# Patient Record
Sex: Female | Born: 1990 | Race: White | Hispanic: No | Marital: Single | Smoking: Never smoker
Health system: Southern US, Community
[De-identification: ages and names within clinical notes are randomized; demographics above are authoritative.]

## PROBLEM LIST (undated history)

## (undated) DIAGNOSIS — N200 Calculus of kidney: Secondary | ICD-10-CM

## (undated) DIAGNOSIS — E282 Polycystic ovarian syndrome: Secondary | ICD-10-CM

## (undated) HISTORY — PX: TONSILLECTOMY: SUR1361

---

## 2010-05-13 ENCOUNTER — Emergency Department (HOSPITAL_BASED_OUTPATIENT_CLINIC_OR_DEPARTMENT_OTHER): Admission: EM | Admit: 2010-05-13 | Discharge: 2010-05-13 | Payer: Self-pay | Admitting: Emergency Medicine

## 2010-05-13 ENCOUNTER — Ambulatory Visit: Payer: Self-pay | Admitting: Diagnostic Radiology

## 2011-01-19 LAB — HEPATIC FUNCTION PANEL
ALT: 11 U/L (ref 0–35)
AST: 20 U/L (ref 0–37)
Albumin: 4.1 g/dL (ref 3.5–5.2)
Alkaline Phosphatase: 65 U/L (ref 39–117)
Bilirubin, Direct: 0 mg/dL (ref 0.0–0.3)
Total Bilirubin: 0.5 mg/dL (ref 0.3–1.2)
Total Protein: 7.9 g/dL (ref 6.0–8.3)

## 2011-01-19 LAB — PREGNANCY, URINE: Preg Test, Ur: NEGATIVE

## 2011-01-19 LAB — URINALYSIS, ROUTINE W REFLEX MICROSCOPIC: pH: 5.5 (ref 5.0–8.0)

## 2011-01-19 LAB — BASIC METABOLIC PANEL
BUN: 14 mg/dL (ref 6–23)
Calcium: 9.5 mg/dL (ref 8.4–10.5)
GFR calc non Af Amer: >60 mL/min (ref >60)
Potassium: 3.4 mEq/L — ABNORMAL LOW (ref 3.5–5.1)

## 2011-01-19 LAB — CBC
HCT: 40.2 % (ref 36.0–46.0)
MCH: 28.4 pg (ref 26.0–34.0)
MCHC: 33 g/dL (ref 30.0–36.0)
MCV: 86.1 fL (ref 78.0–100.0)
WBC: 15.2 10*3/uL — ABNORMAL HIGH (ref 4.0–10.5)

## 2011-01-19 LAB — LIPASE, BLOOD: Lipase: 89 U/L (ref 23–300)

## 2011-01-19 LAB — DIFFERENTIAL
Eosinophils Relative: 1 % (ref 0–5)
Lymphocytes Relative: 31 % (ref 12–46)
Monocytes Relative: 6 % (ref 3–12)
Neutrophils Relative %: 62 % (ref 43–77)

## 2011-01-19 LAB — URINE MICROSCOPIC-ADD ON

## 2011-10-06 DIAGNOSIS — L219 Seborrheic dermatitis, unspecified: Secondary | ICD-10-CM | POA: Insufficient documentation

## 2013-02-04 ENCOUNTER — Emergency Department (HOSPITAL_BASED_OUTPATIENT_CLINIC_OR_DEPARTMENT_OTHER)
Admission: EM | Admit: 2013-02-04 | Discharge: 2013-02-04 | Disposition: A | Discharge status: Home / Self Care | Payer: BC Managed Care – PPO | Attending: Emergency Medicine | Admitting: Emergency Medicine

## 2013-02-04 ENCOUNTER — Emergency Department (HOSPITAL_BASED_OUTPATIENT_CLINIC_OR_DEPARTMENT_OTHER): Payer: BC Managed Care – PPO

## 2013-02-04 ENCOUNTER — Encounter (HOSPITAL_BASED_OUTPATIENT_CLINIC_OR_DEPARTMENT_OTHER): Payer: Self-pay | Admitting: *Deleted

## 2013-02-04 DIAGNOSIS — N23 Unspecified renal colic: Secondary | ICD-10-CM | POA: Insufficient documentation

## 2013-02-04 DIAGNOSIS — Z79899 Other long term (current) drug therapy: Secondary | ICD-10-CM | POA: Insufficient documentation

## 2013-02-04 DIAGNOSIS — Z3202 Encounter for pregnancy test, result negative: Secondary | ICD-10-CM | POA: Insufficient documentation

## 2013-02-04 DIAGNOSIS — Z862 Personal history of diseases of the blood and blood-forming organs and certain disorders involving the immune mechanism: Secondary | ICD-10-CM | POA: Insufficient documentation

## 2013-02-04 DIAGNOSIS — Z87442 Personal history of urinary calculi: Secondary | ICD-10-CM | POA: Insufficient documentation

## 2013-02-04 DIAGNOSIS — Z8639 Personal history of other endocrine, nutritional and metabolic disease: Secondary | ICD-10-CM | POA: Insufficient documentation

## 2013-02-04 HISTORY — DX: Polycystic ovarian syndrome: E28.2

## 2013-02-04 HISTORY — DX: Calculus of kidney: N20.0

## 2013-02-04 LAB — URINALYSIS, ROUTINE W REFLEX MICROSCOPIC
Bilirubin Urine: NEGATIVE
Glucose, UA: NEGATIVE mg/dL
Protein, ur: NEGATIVE mg/dL
pH: 6 (ref 5.0–8.0)

## 2013-02-04 LAB — URINE MICROSCOPIC-ADD ON

## 2013-02-04 LAB — PREGNANCY, URINE: Preg Test, Ur: NEGATIVE

## 2013-02-04 MED ORDER — NAPROXEN 250 MG PO TABS
500.0000 mg | ORAL_TABLET | Freq: Once | ORAL | Status: AC
  Administered 2013-02-04: 500 mg via ORAL
  Filled 2013-02-04: qty 2

## 2013-02-04 MED ORDER — HYDROMORPHONE HCL 4 MG PO TABS: 2.0000 mg | ORAL_TABLET | ORAL | Status: DC | PRN

## 2013-02-04 MED ORDER — NAPROXEN SODIUM 220 MG PO TABS: ORAL_TABLET | ORAL | Status: DC

## 2013-02-04 NOTE — ED Provider Notes (Signed)
History     CSN: [id number]  Arrival date & time 02/04/13  0258   First MD Initiated Contact with Patient 02/04/13 [phone]      Chief Complaint  Patient presents with  . Abdominal Pain    (Consider location/radiation/quality/duration/timing/severity/associated sxs/prior treatment) HPI This is a 22 year old female with history of kidney stones. She also has a questionable history of polycystic ovary disease. She is here with a 24-hour history of pain in the left lower quadrant of the abdomen. The pain is mild to moderate. It is similar to prior or kidney stone pain. There is some associated pain in the left flank. The pain is not worse with movement or ambulation. She has taken ibuprofen without significant relief. There is no associated hematuria, dysuria, fever, chills, nausea, vomiting, vaginal bleeding or vaginal discharge. She has had diarrhea.  Past Medical History  Diagnosis Date  . Kidney stone   . Polycystic disease, ovaries     History reviewed. No pertinent past surgical history.  History reviewed. No pertinent family history.  History  Substance Use Topics  . Smoking status: Never Smoker   . Smokeless tobacco: Not on file  . Alcohol Use: No    OB History   Grav Para Term Preterm Abortions TAB SAB Ect Mult Living                  Review of Systems  All other systems reviewed and are negative.    Allergies  Review of patient's allergies indicates no known allergies.  Home Medications   Current Outpatient Rx  Name  Route  Sig  Dispense  Refill  . metFORMIN (GLUCOPHAGE-XR) 750 MG 24 hr tablet   Oral   Take 750 mg by mouth daily with breakfast.         . spironolactone (ALDACTONE) 100 MG tablet   Oral   Take 200 mg by mouth daily.           BP 128/79  Pulse 82  Temp(Src) 98.6 F (37 C) (Oral)  Resp 15  Ht 5\' 6"  (1.676 m)  Wt 158 lb (71.668 kg)  BMI 25.51 kg/m2  SpO2 100%  LMP 01/30/2013  Physical Exam General: Well-developed,  well-nourished female in no acute distress; appearance consistent with age of record HENT: normocephalic, atraumatic Eyes: pupils equal round and reactive to light; extraocular muscles intact Neck: supple Heart: regular rate and rhythm; no murmurs, rubs or gallops Lungs: clear to auscultation bilaterally Abdomen: soft; nondistended; mild tenderness in left lower quadrant on deep palpation; no masses or hepatosplenomegaly; bowel sounds present GU: No CVA tenderness Extremities: No deformity; full range of motion; pulses normal; no edema Neurologic: Awake, alert and oriented; motor function intact in all extremities and symmetric; no facial droop Skin: Warm and dry Psychiatric: Normal mood and affect    ED Course  Procedures (including critical care time)     MDM   Nursing notes and vitals signs, including pulse oximetry, reviewed.  Summary of this visit's results, reviewed by myself:  Labs:  Results for orders placed during the hospital encounter of 02/04/13 (from the past 24 hour(s))  URINALYSIS, ROUTINE W REFLEX MICROSCOPIC     Status: Abnormal   Collection Time    02/04/13  3:12 AM      Result Value Range   Color, Urine YELLOW  YELLOW   APPearance HAZY (*) CLEAR   Specific Gravity, Urine 1.013  1.005 - 1.030   pH 6.0  5.0 - 8.0  Glucose, UA NEGATIVE  NEGATIVE mg/dL   Hgb urine dipstick LARGE (*) NEGATIVE   Bilirubin Urine NEGATIVE  NEGATIVE   Ketones, ur NEGATIVE  NEGATIVE mg/dL   Protein, ur NEGATIVE  NEGATIVE mg/dL   Urobilinogen, UA 0.2  0.0 - 1.0 mg/dL   Nitrite NEGATIVE  NEGATIVE   Leukocytes, UA NEGATIVE  NEGATIVE  PREGNANCY, URINE     Status: None   Collection Time    02/04/13  3:12 AM      Result Value Range   Preg Test, Ur NEGATIVE  NEGATIVE  URINE MICROSCOPIC-ADD ON     Status: None   Collection Time    02/04/13  3:12 AM      Result Value Range   Squamous Epithelial / LPF RARE  RARE   WBC, UA 0-2  <3 WBC/hpf   RBC / HPF 21-50  <3 RBC/hpf    Urine-Other AMORPHOUS URATES/PHOSPHATES      Imaging Studies: Ct Abdomen Pelvis Wo Contrast  02/04/2013  *RADIOLOGY REPORT*  Clinical Data: Left flank pain.  Left lower quadrant pain. Hematuria.  History of renal stones.  CT ABDOMEN AND PELVIS WITHOUT CONTRAST  Technique:  Multidetector CT imaging of the abdomen and pelvis was performed following the standard protocol without intravenous contrast.  Comparison: 05/13/2010  Findings: The lung bases are clear.  There is a 3 mm stone in the left ureteropelvic junction without significant proximal obstruction.  Mild periureteral stranding. Punctate sized nonobstructing stone in the lower pole of the right kidney.  Bladder is decompressed but there is no evidence of significant bladder wall thickening or stones.  The unenhanced appearance of the liver, spleen, gallbladder, pancreas, adrenal glands, abdominal aorta, inferior vena cava, and retroperitoneal lymph nodes is unremarkable.  The stomach and small bowel are decompressed.  Stool fills the colon without distension. No free air or free fluid in the abdomen.  Pelvis:  The appendix is normal.  No free or loculated pelvic fluid collections.  Tampon in the vagina.  Uterus and adnexal structures are not enlarged.  No significant pelvic lymphadenopathy. Scattered diverticula in the sigmoid colon without diverticulitis. Normal alignment of the lumbar vertebrae.  IMPRESSION: 3 mm stone in the left ureteropelvic junction with minimal proximal obstructive changes.  Punctate sized nonobstructing stone in the right kidney.   Original Report Authenticated By: Burman Nieves, M.D.             Hanley Seamen, MD 02/04/13 [phone]

## 2013-02-04 NOTE — ED Notes (Signed)
C/o LLQ pain x 24 hours with nausea. Denies any urinary sxs or vaginal d/c.

## 2013-02-04 NOTE — ED Notes (Signed)
MD at bedside. 

## 2013-02-04 NOTE — ED Notes (Signed)
Patient transported to CT 

## 2013-02-25 ENCOUNTER — Encounter (HOSPITAL_COMMUNITY): Payer: Self-pay | Admitting: Emergency Medicine

## 2013-02-25 ENCOUNTER — Emergency Department (HOSPITAL_COMMUNITY)
Admission: EM | Admit: 2013-02-25 | Discharge: 2013-02-26 | Disposition: A | Discharge status: Home / Self Care | Payer: BC Managed Care – PPO | Attending: Emergency Medicine | Admitting: Emergency Medicine

## 2013-02-25 DIAGNOSIS — Z3202 Encounter for pregnancy test, result negative: Secondary | ICD-10-CM | POA: Insufficient documentation

## 2013-02-25 DIAGNOSIS — R112 Nausea with vomiting, unspecified: Secondary | ICD-10-CM | POA: Insufficient documentation

## 2013-02-25 DIAGNOSIS — R109 Unspecified abdominal pain: Secondary | ICD-10-CM | POA: Insufficient documentation

## 2013-02-25 DIAGNOSIS — Z862 Personal history of diseases of the blood and blood-forming organs and certain disorders involving the immune mechanism: Secondary | ICD-10-CM | POA: Insufficient documentation

## 2013-02-25 DIAGNOSIS — Z79899 Other long term (current) drug therapy: Secondary | ICD-10-CM | POA: Insufficient documentation

## 2013-02-25 DIAGNOSIS — Z8639 Personal history of other endocrine, nutritional and metabolic disease: Secondary | ICD-10-CM | POA: Insufficient documentation

## 2013-02-25 DIAGNOSIS — R350 Frequency of micturition: Secondary | ICD-10-CM | POA: Insufficient documentation

## 2013-02-25 DIAGNOSIS — R3915 Urgency of urination: Secondary | ICD-10-CM | POA: Insufficient documentation

## 2013-02-25 DIAGNOSIS — N200 Calculus of kidney: Secondary | ICD-10-CM | POA: Insufficient documentation

## 2013-02-25 LAB — URINALYSIS, ROUTINE W REFLEX MICROSCOPIC
Bilirubin Urine: NEGATIVE
Glucose, UA: NEGATIVE mg/dL
Ketones, ur: 15 mg/dL — AB
Leukocytes, UA: NEGATIVE
Nitrite: NEGATIVE
Protein, ur: 30 mg/dL — AB
Specific Gravity, Urine: 1.03 (ref 1.005–1.030)

## 2013-02-25 LAB — URINE MICROSCOPIC-ADD ON

## 2013-02-25 NOTE — ED Notes (Addendum)
Pt states she went to urologist Thurs and was informed she had a Left side kidney stone. MD told pt unsure if the kidney stone would pass. If not, and pain did not get better to go back to the urologist and they would remove it.

## 2013-02-26 ENCOUNTER — Emergency Department (HOSPITAL_COMMUNITY): Payer: BC Managed Care – PPO

## 2013-02-26 LAB — POCT I-STAT, CHEM 8
Glucose, Bld: 95 mg/dL (ref 70–99)
HCT: 38 % (ref 36.0–46.0)
Hemoglobin: 12.9 g/dL (ref 12.0–15.0)
Potassium: 3.9 mEq/L (ref 3.5–5.1)
Sodium: 140 mEq/L (ref 135–145)

## 2013-02-26 MED ORDER — HYDROMORPHONE HCL PF 1 MG/ML IJ SOLN
1.0000 mg | Freq: Once | INTRAMUSCULAR | Status: AC
  Administered 2013-02-26: 1 mg via INTRAVENOUS
  Filled 2013-02-26: qty 1

## 2013-02-26 MED ORDER — ONDANSETRON HCL 4 MG/2ML IJ SOLN
4.0000 mg | Freq: Once | INTRAMUSCULAR | Status: AC
  Administered 2013-02-26: 4 mg via INTRAVENOUS
  Filled 2013-02-26: qty 2

## 2013-02-26 MED ORDER — OXYCODONE-ACETAMINOPHEN 5-325 MG PO TABS
1.0000 | ORAL_TABLET | Freq: Once | ORAL | Status: AC
  Administered 2013-02-26: 1 via ORAL
  Filled 2013-02-26: qty 1

## 2013-02-26 MED ORDER — OXYCODONE-ACETAMINOPHEN 5-325 MG PO TABS: 1.0000 | ORAL_TABLET | Freq: Four times a day (QID) | ORAL | Status: DC | PRN

## 2013-02-26 MED ORDER — SODIUM CHLORIDE 0.9 % IV BOLUS (SEPSIS)
500.0000 mL | Freq: Once | INTRAVENOUS | Status: AC
  Administered 2013-02-26: 500 mL via INTRAVENOUS

## 2013-02-26 NOTE — ED Provider Notes (Signed)
Medical screening examination/treatment/procedure(s) were performed by non-physician practitioner and as supervising physician I was immediately available for consultation/collaboration.  Jones Skene, M.D.  Jones Skene, MD 02/26/13 [phone]

## 2013-02-26 NOTE — ED Provider Notes (Signed)
History     CSN: [id number]  Arrival date & time 02/25/13  2246   First MD Initiated Contact with Patient 02/26/13 0015      No chief complaint on file.   (Consider location/radiation/quality/duration/timing/severity/associated sxs/prior treatment) The history is provided by the patient, medical records and a parent. No language interpreter was used.    Olivia Le is a 22 y.o. female  with a hx of kidney stones presents to the Emergency Department complaining of gradual, persistent, progressively worsening L flank pain and L abdominal pain onset tonight about 7pm.  Pt describes the pain as sharp and throbbing, worst in the LLQ of the abd radiating down from the L flank, rated at a 10/10. Pt saw Dr Brunilda Payor this morning who said that the stone might pass but if not then they would talk about taking it out on Monday.  Associated symptoms include nausea and vomiting.  Nothing makes it better and nothing makes it worse. Patient denies fever, chills, headache, neck pain, chest pain, shortness of breath, diarrhea, weakness, dizziness, syncope, rash.    Past Medical History  Diagnosis Date  . Kidney stone   . Polycystic disease, ovaries     History reviewed. No pertinent past surgical history.  History reviewed. No pertinent family history.  History  Substance Use Topics  . Smoking status: Never Smoker   . Smokeless tobacco: Not on file  . Alcohol Use: No    OB History   Grav Para Term Preterm Abortions TAB SAB Ect Mult Living                  Review of Systems  Constitutional: Negative for fever, diaphoresis, appetite change and fatigue.  Respiratory: Negative for shortness of breath.   Cardiovascular: Negative for chest pain.  Gastrointestinal: Positive for nausea, vomiting and abdominal pain. Negative for diarrhea, constipation and blood in stool.  Genitourinary: Positive for urgency, frequency and flank pain. Negative for dysuria, hematuria and difficulty urinating.   Musculoskeletal: Negative for back pain.  Skin: Negative for rash.  Neurological: Negative for headaches.  All other systems reviewed and are negative.    Allergies  Review of patient's allergies indicates no known allergies.  Home Medications   Current Outpatient Rx  Name  Route  Sig  Dispense  Refill  . HYDROmorphone (DILAUDID) 4 MG tablet   Oral   Take 0.5-1 tablets (2-4 mg total) by mouth every 4 (four) hours as needed for pain.   30 tablet   0   . metFORMIN (GLUCOPHAGE-XR) 750 MG 24 hr tablet   Oral   Take 750 mg by mouth daily with breakfast.         . naproxen sodium (ALEVE) 220 MG tablet      Take 2 tablets every 12 hours until stone passes.         Marland Kitchen oxyCODONE-acetaminophen (PERCOCET/ROXICET) 5-325 MG per tablet   Oral   Take 1.5 tablets by mouth every 4 (four) hours as needed for pain.         Marland Kitchen spironolactone (ALDACTONE) 100 MG tablet   Oral   Take 200 mg by mouth daily.           BP 137/90  Pulse 110  Temp(Src) 98 F (36.7 C) (Oral)  Resp 20  Wt 158 lb (71.668 kg)  BMI 25.51 kg/m2  SpO2 96%  LMP 02/25/2013  Physical Exam  Nursing note and vitals reviewed. Constitutional: She appears well-developed and well-nourished. No distress.  HENT:  Head: Normocephalic and atraumatic.  Mouth/Throat: Oropharynx is clear and moist. No oropharyngeal exudate.  Eyes: Conjunctivae and EOM are normal. Pupils are equal, round, and reactive to light.  Neck: Normal range of motion.  Cardiovascular: Regular rhythm, normal heart sounds and intact distal pulses.  Tachycardia present.   Pulses:      Radial pulses are 2+ on the right side, and 2+ on the left side.       Dorsalis pedis pulses are 2+ on the right side, and 2+ on the left side.       Posterior tibial pulses are 2+ on the right side, and 2+ on the left side.  Pulmonary/Chest: Effort normal and breath sounds normal. No respiratory distress. She has no wheezes. She has no rales. She exhibits no  tenderness.  Abdominal: Soft. Normal appearance and bowel sounds are normal. She exhibits no distension and no mass. There is tenderness (mild) in the left lower quadrant. There is CVA tenderness (Left). There is no rebound and no guarding.  Musculoskeletal: Normal range of motion. She exhibits no edema and no tenderness.  Lymphadenopathy:    She has no cervical adenopathy.  Neurological: She is alert. She exhibits normal muscle tone. Coordination normal.  Skin: Skin is warm and dry. No rash noted. She is not diaphoretic. No erythema.  Psychiatric: She has a normal mood and affect.    ED Course  Procedures (including critical care time)  Labs Reviewed  URINALYSIS, ROUTINE W REFLEX MICROSCOPIC - Abnormal; Notable for the following:    Hgb urine dipstick SMALL (*)    Ketones, ur 15 (*)    Protein, ur 30 (*)    All other components within normal limits  URINE MICROSCOPIC-ADD ON - Abnormal; Notable for the following:    Crystals CA OXALATE CRYSTALS (*)    All other components within normal limits  PREGNANCY, URINE   Dg Abd 1 View  02/26/2013  *RADIOLOGY REPORT*  Clinical Data: Left-sided flank pain, nausea and vomiting  ABDOMEN - 1 VIEW  Comparison: 04/24/2014radiograph at [facility], report not available  Findings: Normal bowel gas pattern.  No abnormal radiopacity. Presumed left pelvic phlebolith again noted.  Mild rightward curvature of the thoracolumbar spine centered at T12 is likely positional. Presence or absence of air fluid levels or free air cannot be assessed on this single supine view.  IMPRESSION: Normal bowel gas pattern.  No abnormal radiopacity.   Original Report Authenticated By: Christiana Pellant, M.D.      1. Nephrolithiasis       MDM  Olivia Le presents with hx of kidney stone and was seen by Dr Brunilda Payor today.  I reviewed the CT on 02/04/13 which showed a 3mm stone at the UVJ without significant hydronephrosis.  Pt has been diagnosed with a Kidney  Stone via CT 3 weeks ago.  KUB with L pelvic phlebolith noted.  I personally reviewed the imaging tests through PACS system.  I reviewed available ER/hospitalization records through the EMR.  There is no evidence of significant hydronephrosis, UA without evidence of infection, vitals sign stable and the pt does not have irratractable vomiting.   Discussed with Olivia Le who will follow chem 8 to check kidney function.  If normal, plan is for Pt to be dc home with pain medications & follow up with Dr Brunilda Payor regarding further plans.          Olivia Client Antoinette Haskett, PA-C 02/26/13 0136

## 2013-02-26 NOTE — ED Provider Notes (Signed)
  Physical Exam  BP 137/90  Pulse 110  Temp(Src) 98 F (36.7 C) (Oral)  Resp 20  Wt 158 lb (71.668 kg)  BMI 25.51 kg/m2  SpO2 96%  LMP 02/25/2013  Physical Exam Patient states she has received significant pain relief, but voices concern that the Vicodin.  At home, may not be strong enough to keep her comfortable until her appointment on Monday.  I discussed with her and her mother, the use of stronger medication called, Percocet.  They're in agreement that they will not use both medications at the same time ED Course  Procedures  MDM       Arman Filter, NP 02/26/13 [phone]

## 2013-02-26 NOTE — ED Provider Notes (Signed)
Medical screening examination/treatment/procedure(s) were performed by non-physician practitioner and as supervising physician I was immediately available for consultation/collaboration.  John-Adam Emrik Erhard, M.D.  John-Adam Malon Branton, MD 02/26/13 0741 

## 2014-09-28 IMAGING — CT CT ABD-PELV W/O CM
2 of 4 series · 16 of 46 positions shown, 18 images · non-contrast
Comparison: 05/13/2010

CLINICAL DATA: Left flank pain.  Left lower quadrant pain.
Hematuria.  History of renal stones.

CT ABDOMEN AND PELVIS WITHOUT CONTRAST
TECHNIQUE: Multidetector CT imaging of the abdomen and pelvis was
performed following the standard protocol without intravenous
contrast.

[Series 2: renal stone < 200 lbs 5.0 b31f · axial · 0.72mm/px · z∈[+635,+1080]mm · 13 of 99 slices shown, 15 images]
[im 5/99  soft-tissue]
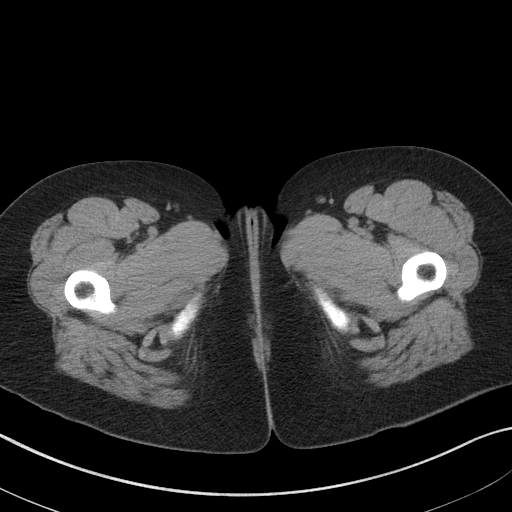
[im 5/99  bone]
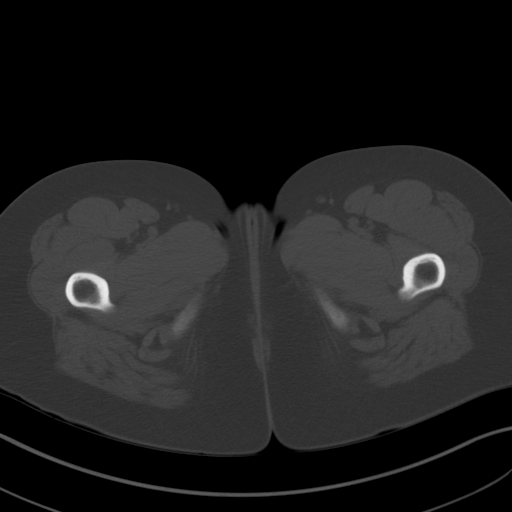
[im 13/99  soft-tissue]
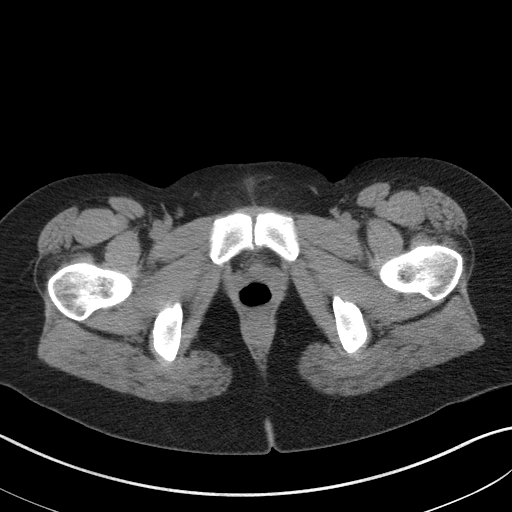
[im 21/99  soft-tissue]
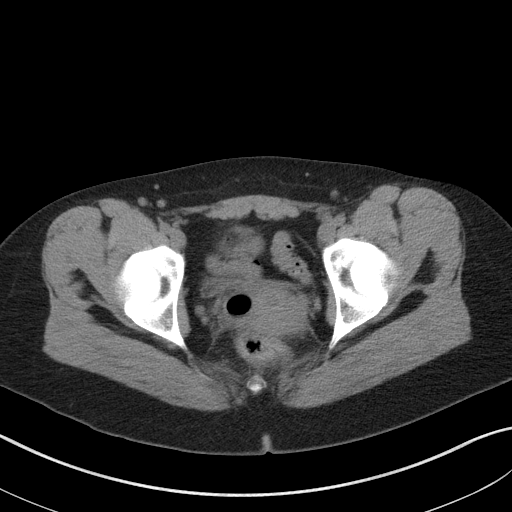
[im 29/99  soft-tissue]
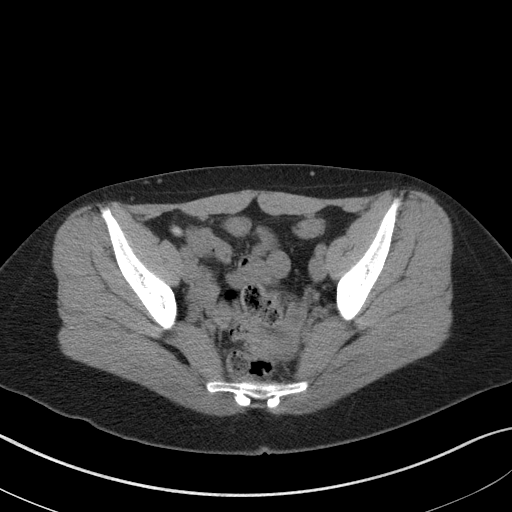
[im 33/99  soft-tissue]
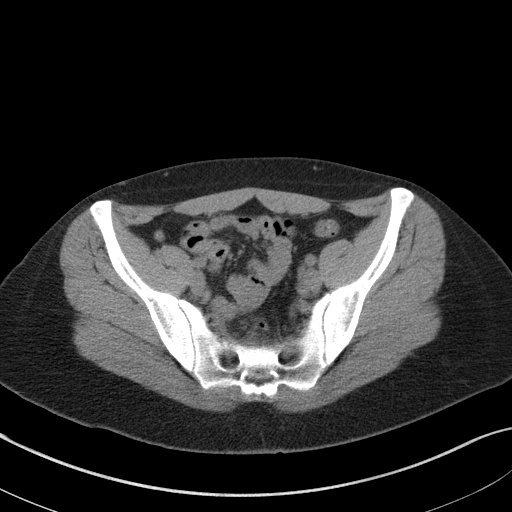
[im 41/99  soft-tissue]
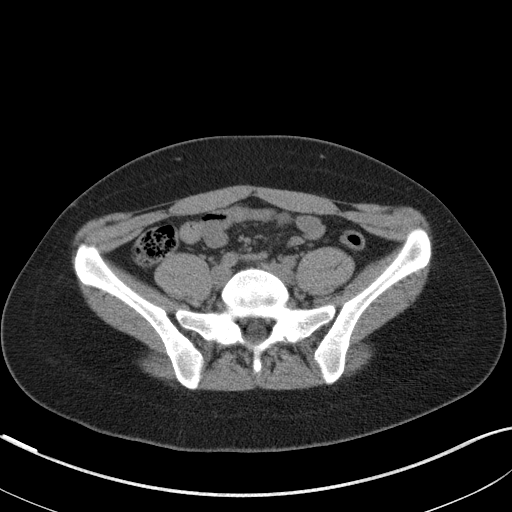
[im 50/99  soft-tissue]
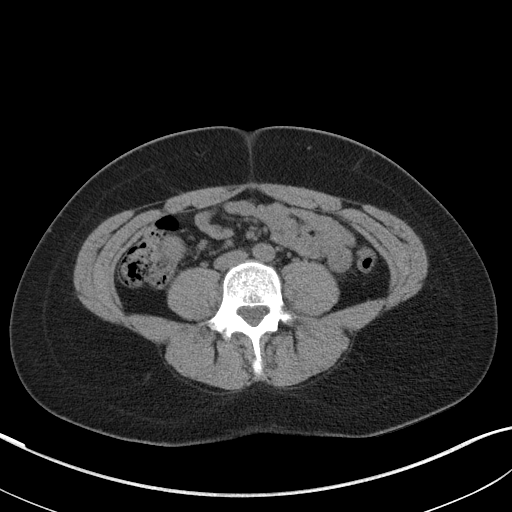
[im 58/99  soft-tissue]
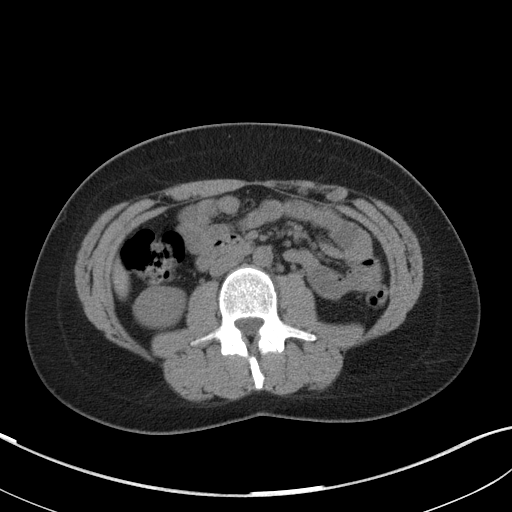
[im 66/99  soft-tissue]
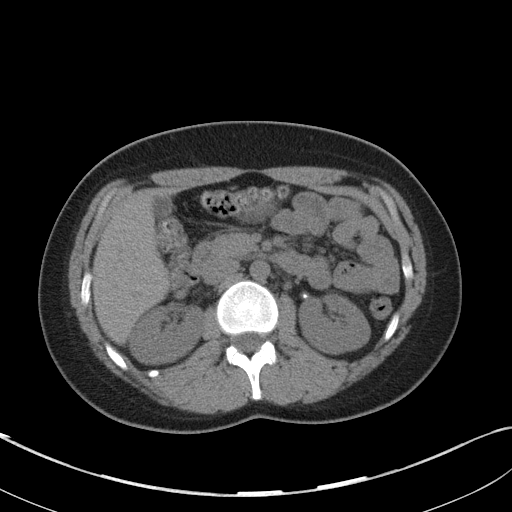
[im 66/99  bone]
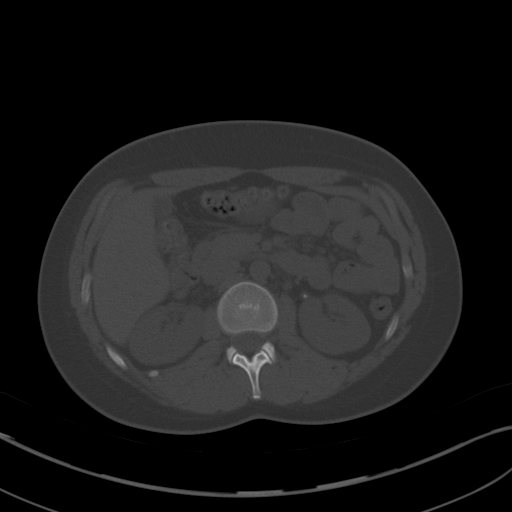
[im 70/99  soft-tissue]
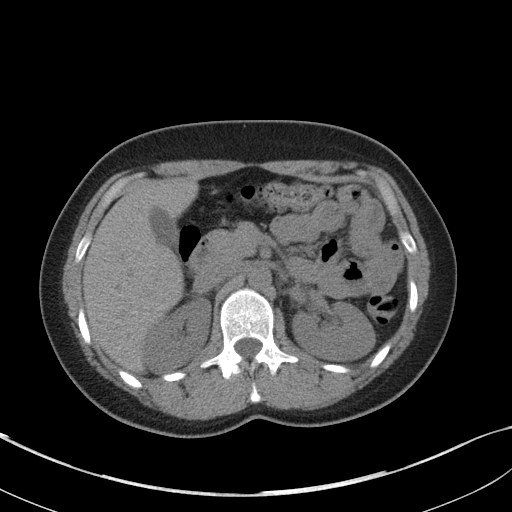
[im 78/99  soft-tissue]
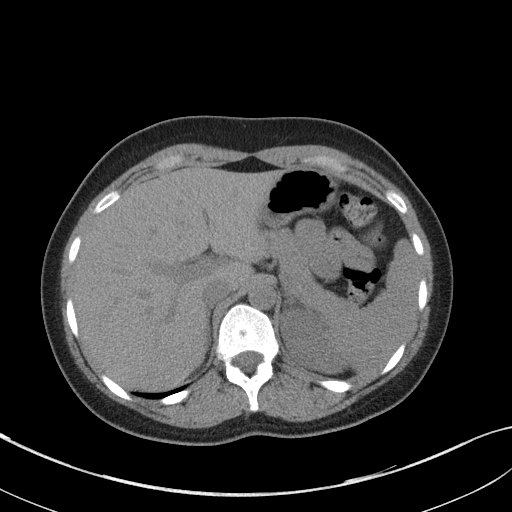
[im 86/99  soft-tissue]
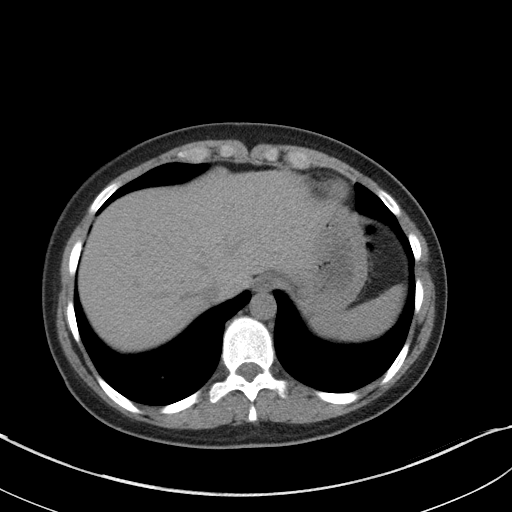
[im 94/99  soft-tissue]
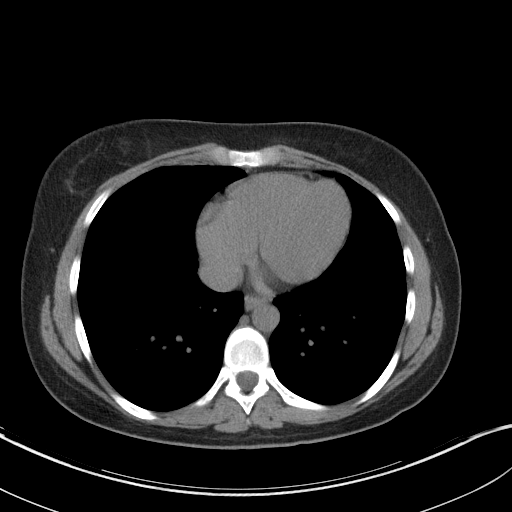

[Series 5: renal stone 3.0 coronal · coronal · 0.64mm/px · 3 of 71 slices shown]
[im 24/71  soft-tissue]
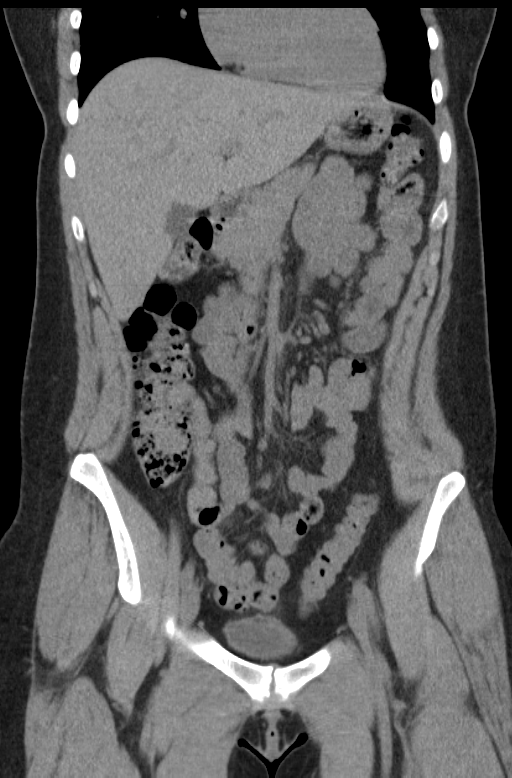
[im 32/71  soft-tissue]
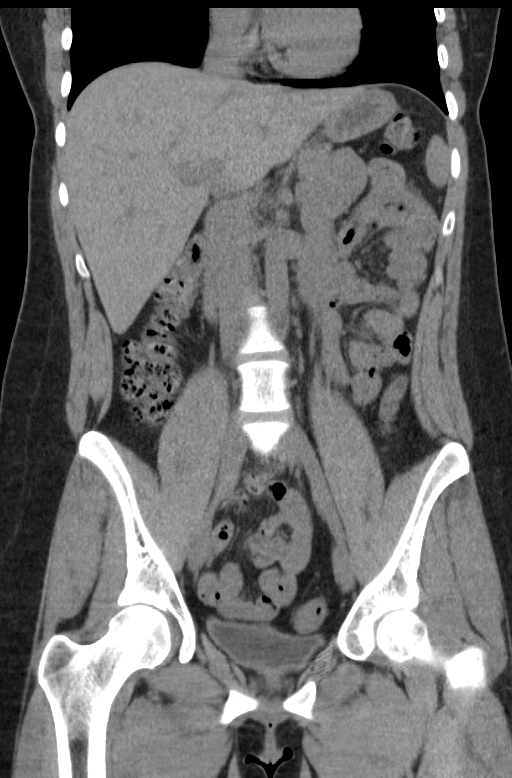
[im 39/71  soft-tissue]
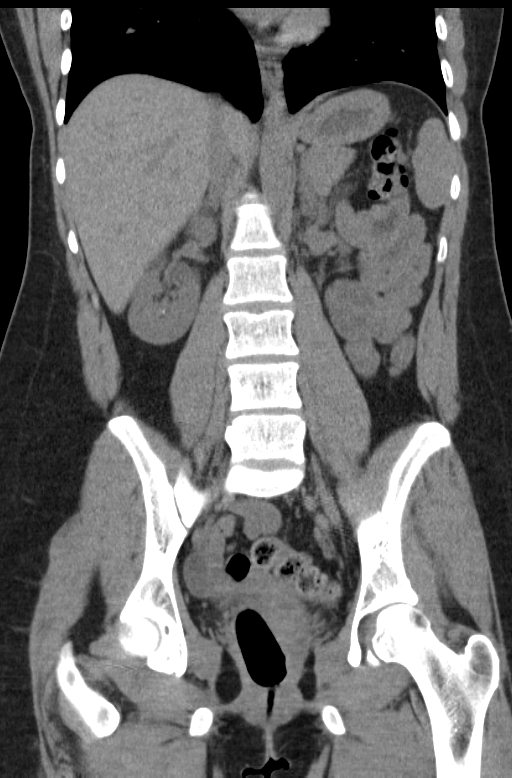

[16 of 46 positions shown; findings below may reference images not displayed]

FINDINGS: The lung bases are clear.

There is a 3 mm stone in the left ureteropelvic junction without
significant proximal obstruction.  Mild periureteral stranding.
Punctate sized nonobstructing stone in the lower pole of the right
kidney.  Bladder is decompressed but there is no evidence of
significant bladder wall thickening or stones.

The unenhanced appearance of the liver, spleen, gallbladder,
pancreas, adrenal glands, abdominal aorta, inferior vena cava, and
retroperitoneal lymph nodes is unremarkable.  The stomach and small
bowel are decompressed.  Stool fills the colon without distension.
No free air or free fluid in the abdomen.

Pelvis:  The appendix is normal.  No free or loculated pelvic fluid
collections.  Tampon in the vagina.  Uterus and adnexal structures
are not enlarged.  No significant pelvic lymphadenopathy.
Scattered diverticula in the sigmoid colon without diverticulitis.
Normal alignment of the lumbar vertebrae.
IMPRESSION: 3 mm stone in the left ureteropelvic junction with minimal proximal
obstructive changes.  Punctate sized nonobstructing stone in the
right kidney.

## 2016-05-28 ENCOUNTER — Other Ambulatory Visit: Payer: Self-pay | Admitting: Chiropractic Medicine

## 2019-06-21 DIAGNOSIS — F3342 Major depressive disorder, recurrent, in full remission: Secondary | ICD-10-CM | POA: Insufficient documentation

## 2019-07-02 ENCOUNTER — Ambulatory Visit (HOSPITAL_COMMUNITY): Payer: PRIVATE HEALTH INSURANCE

## 2019-07-02 ENCOUNTER — Ambulatory Visit (INDEPENDENT_AMBULATORY_CARE_PROVIDER_SITE_OTHER): Payer: PRIVATE HEALTH INSURANCE

## 2019-07-02 ENCOUNTER — Encounter (HOSPITAL_COMMUNITY): Payer: Self-pay | Admitting: Emergency Medicine

## 2019-07-02 ENCOUNTER — Other Ambulatory Visit: Payer: Self-pay

## 2019-07-02 ENCOUNTER — Ambulatory Visit (HOSPITAL_COMMUNITY)
Admission: EM | Admit: 2019-07-02 | Discharge: 2019-07-02 | Disposition: A | Discharge status: Home / Self Care | Payer: PRIVATE HEALTH INSURANCE | Attending: Urgent Care | Admitting: Urgent Care

## 2019-07-02 DIAGNOSIS — R0781 Pleurodynia: Secondary | ICD-10-CM | POA: Diagnosis not present

## 2019-07-02 DIAGNOSIS — R0789 Other chest pain: Secondary | ICD-10-CM

## 2019-07-02 DIAGNOSIS — S20212A Contusion of left front wall of thorax, initial encounter: Secondary | ICD-10-CM | POA: Diagnosis not present

## 2019-07-02 MED ORDER — NAPROXEN 500 MG PO TABS: 500.0000 mg | ORAL_TABLET | Freq: Two times a day (BID) | ORAL | 0 refills | Status: DC

## 2019-07-02 MED ORDER — CYCLOBENZAPRINE HCL 5 MG PO TABS: 5.0000 mg | ORAL_TABLET | Freq: Three times a day (TID) | ORAL | 0 refills | Status: DC | PRN

## 2019-07-02 NOTE — ED Triage Notes (Signed)
Pt sts fell and injured left sided rib area several weeks ago that was improved and now more painful again

## 2019-07-02 NOTE — ED Provider Notes (Signed)
MRN: [id number] DOB: 1991-01-18  Subjective:   Olivia Le is a 28 y.o. female presenting for 5 day history of recurrent left sided chest wall/rib pain worse with use of her left arm or taking a deep breath. Patient suffered a fall a few weeks ago. She was dog sitting, stepped over a dog gate and fell into a side dresser. She did not get an x-ray then. Had some bruising that resolved on its own. Has used ibuprofen with some relief except in the past few days, feels like the pain is worse than when she initially hurt herself. Denies smoking cigarettes or alcohol use.   LMP was 06/20/2019, has not been sexually active since then.   Takes Effexor only.    No current facility-administered medications for this encounter.   Current Outpatient Medications:  .  metFORMIN (GLUCOPHAGE-XR) 750 MG 24 hr tablet, Take 750 mg by mouth daily with breakfast., Disp: , Rfl:  .  naproxen sodium (ALEVE) 220 MG tablet, Take 2 tablets every 12 hours until stone passes., Disp: , Rfl:  .  spironolactone (ALDACTONE) 100 MG tablet, Take 200 mg by mouth daily., Disp: , Rfl:     No Known Allergies   Past Medical History:  Diagnosis Date  . Kidney stone   . Polycystic disease, ovaries      History reviewed. No pertinent surgical history.   Review of Systems  Constitutional: Negative for fever and malaise/fatigue.  HENT: Negative for congestion, ear pain, sinus pain and sore throat.   Eyes: Negative for blurred vision, double vision, discharge and redness.  Respiratory: Negative for cough, hemoptysis, shortness of breath and wheezing.   Cardiovascular: Positive for chest pain.  Gastrointestinal: Negative for abdominal pain, diarrhea, nausea and vomiting.  Genitourinary: Negative for dysuria, flank pain and hematuria.  Musculoskeletal: Negative for myalgias.  Skin: Negative for rash.  Neurological: Negative for dizziness, weakness and headaches.  Psychiatric/Behavioral: Negative for depression and  substance abuse.    Objective:   Vitals: BP 128/82 (BP Location: Right Arm)   Pulse 98   Temp 98.4 F (36.9 C) (Oral)   Resp 18   SpO2 97%   Physical Exam Constitutional:      General: She is not in acute distress.    Appearance: Normal appearance. She is well-developed. She is not ill-appearing, toxic-appearing or diaphoretic.  HENT:     Head: Normocephalic and atraumatic.     Nose: Nose normal.     Mouth/Throat:     Mouth: Mucous membranes are moist.  Eyes:     Extraocular Movements: Extraocular movements intact.     Pupils: Pupils are equal, round, and reactive to light.  Cardiovascular:     Rate and Rhythm: Normal rate and regular rhythm.     Pulses: Normal pulses.     Heart sounds: Normal heart sounds. No murmur. No friction rub. No gallop.   Pulmonary:     Effort: Pulmonary effort is normal. No respiratory distress.     Breath sounds: Normal breath sounds. No stridor. No wheezing, rhonchi or rales.  Chest:     Chest wall: Tenderness present. No swelling, crepitus or edema.    Skin:    General: Skin is warm and dry.     Findings: No rash.  Neurological:     Mental Status: She is alert and oriented to person, place, and time.  Psychiatric:        Mood and Affect: Mood normal.        Behavior: Behavior normal.  Thought Content: Thought content normal.     Dg Ribs Unilateral W/chest Left  Result Date: 07/02/2019 CLINICAL DATA:  Left rib injury after fall June 01, 2019. Pain is recently increased, particularly with deep inspiration. EXAM: LEFT RIBS AND CHEST - 3+ VIEW COMPARISON:  None. FINDINGS: No fracture or other bone lesions are seen involving the ribs. There is no evidence of pneumothorax or pleural effusion. Both lungs are clear. Heart size and mediastinal contours are within normal limits. IMPRESSION: Negative. Electronically Signed   By: Gerome Samavid  Williams III M.D   On: 07/02/2019 15:50     Assessment and Plan :   1. Left-sided chest wall pain    2. Chest pain, pleuritic   3. Rib contusion, left, initial encounter     We will manage conservatively for musculoskeletal type pain associated with the rib injury from a few weeks ago.  Counseled on use of NSAID, muscle relaxant and modification of physical activity.  Anticipatory guidance provided.  Counseled patient on potential for adverse effects with medications prescribed/recommended today, ER and return-to-clinic precautions discussed, patient verbalized understanding.    Wallis BambergMani, [place], [place] JerseyPA-C 07/02/19 1559

## 2019-07-08 ENCOUNTER — Telehealth (HOSPITAL_COMMUNITY): Payer: Self-pay

## 2019-07-08 ENCOUNTER — Ambulatory Visit (INDEPENDENT_AMBULATORY_CARE_PROVIDER_SITE_OTHER)
Admission: RE | Admit: 2019-07-08 | Discharge: 2019-07-08 | Disposition: A | Discharge status: Home / Self Care | Payer: PRIVATE HEALTH INSURANCE | Source: Ambulatory Visit

## 2019-07-08 DIAGNOSIS — W01198D Fall on same level from slipping, tripping and stumbling with subsequent striking against other object, subsequent encounter: Secondary | ICD-10-CM | POA: Diagnosis not present

## 2019-07-08 DIAGNOSIS — S20212D Contusion of left front wall of thorax, subsequent encounter: Secondary | ICD-10-CM

## 2019-07-08 DIAGNOSIS — R0789 Other chest pain: Secondary | ICD-10-CM | POA: Diagnosis not present

## 2019-07-08 MED ORDER — METHOCARBAMOL 500 MG PO TABS: 500.0000 mg | ORAL_TABLET | Freq: Two times a day (BID) | ORAL | 0 refills | Status: DC

## 2019-07-08 MED ORDER — MELOXICAM 7.5 MG PO TABS: 7.5000 mg | ORAL_TABLET | Freq: Every day | ORAL | 0 refills | Status: DC

## 2019-07-08 NOTE — ED Provider Notes (Signed)
[facility] Virtual Visit via Video Note:  Olivia Le  initiated request for Telemedicine visit with [facility] team. I connected with Olivia Le  on 07/08/2019 at 5:29 PM  for a synchronized telemedicine visit using a telephone enabled HIPPA compliant telemedicine application. I verified that I am speaking with Olivia Le  using two identifiers. Rennis HardingBrittany Bani Gianfrancesco, PA-C  was physically located in a [facility] site and Olivia MessingGretchen Medero was located at a different location.   The limitations of evaluation and management by telemedicine as well as the availability of in-person appointments were discussed. Patient was informed that she  may incur a bill ( including co-pay) for this virtual visit encounter. Olivia Le  expressed understanding and gave verbal consent to proceed with virtual visit.   [id number] 07/08/19 Arrival Time: 1710  CC: Rib pain  SUBJECTIVE: History from: patient. Olivia Le is a 28 y.o. female complains of left sided rib pain that began a little over 1 month ago, but has worsened over the past week.  States initial injury occurred after she tripped while stepping over a dog gate and hit the left side of her ribs on a side table.  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 Localizes the pain to the left side of her ribs and behind her left breast.  Describes the pain as intermittent and sharp in character.  Was seen on 07/02/19, x-rays were negative for fracture at that time, and was diagnosed with rib contusion.  Was started on naproxen and flexeril, reports minimal relief with these medications.  Symptoms are made worse with deep breath.  Initially had some bruising, but now resolved.  Denies fever, chills, nausea, vomiting, erythema, ecchymosis, effusion, chest pain, SOB, dyspnea.    ROS: As per HPI.  All  other pertinent ROS negative.     Past Medical History:  Diagnosis Date  . Kidney stone   . Polycystic disease, ovaries    No past surgical history on file. No Known Allergies No current facility-administered medications on file prior to encounter.    Current Outpatient Medications on File Prior to Encounter  Medication Sig Dispense Refill  . metFORMIN (GLUCOPHAGE-XR) 750 MG 24 hr tablet Take 750 mg by mouth daily with breakfast.    . naproxen sodium (ALEVE) 220 MG tablet Take 2 tablets every 12 hours until stone passes.    Marland Kitchen. spironolactone (ALDACTONE) 100 MG tablet Take 200 mg by mouth daily.      OBJECTIVE:  There were no vitals filed for this visit.  Spoke to patient via telephone  General: alert; no distress HENT: no muffled voice sounds, or hot potato voice Lungs: normal respiratory effort; speaking in full sentences without difficulty Neurologic: No slurred speech Psychological: alert and cooperative; normal mood and affect  ASSESSMENT & PLAN:  1. Contusion of rib on left side, subsequent encounter   2. Chest wall pain     Meds ordered this encounter  Medications  . meloxicam (MOBIC) 7.5 MG tablet    Sig: Take 1 tablet (7.5 mg total) by mouth daily.    Dispense:  20 tablet    Refill:  0    Order Specific Question:   Supervising Provider    Answer:   Eustace MooreNELSON, YVONNE SUE [[id number]]  . methocarbamol (ROBAXIN) 500 MG tablet    Sig: Take 1 tablet (500 mg  total) by mouth 2 (two) times daily.    Dispense:  20 tablet    Refill:  0    Order Specific Question:   Supervising Provider    Answer:   Raylene Everts [[id number]]    Continue conservative management of rest, ice, and gentle stretches Avoid strenuous activity or exercise Take mobic as needed for pain relief (may cause abdominal discomfort, ulcers, and GI bleeds avoid taking with other NSAIDs) Take robaxin at nighttime for symptomatic relief. Avoid driving or operating heavy machinery while using medication.  Follow up in person or with PCP if symptoms persist Follow up in person or go to the ER if you have any new or worsening symptoms (fever, chills, chest pain, abdominal pain, difficulty breathing, shortness of breathing, worsening symptoms despite medications, etc...)    I discussed the assessment and treatment plan with the patient. The patient was provided an opportunity to ask questions and all were answered. The patient agreed with the plan and demonstrated an understanding of the instructions.   The patient was advised to call back or seek an in-person evaluation if the symptoms worsen or if the condition fails to improve as anticipated.  I provided 10 minutes of non-face-to-face time during this encounter.  [place], PA-C  07/08/2019 5:29 PM     Lestine Box, PA-C 07/08/19 1729

## 2019-07-08 NOTE — Discharge Instructions (Addendum)
Continue conservative management of rest, ice, and gentle stretches Avoid strenuous activity or exercise Take mobic as needed for pain relief (may cause abdominal discomfort, ulcers, and GI bleeds avoid taking with other NSAIDs) Take robaxin at nighttime for symptomatic relief. Avoid driving or operating heavy machinery while using medication. Follow up in person or with PCP if symptoms persist Follow up in person or go to the ER if you have any new or worsening symptoms (fever, chills, chest pain, abdominal pain, difficulty breathing, shortness of breathing, worsening symptoms despite medications, etc...)

## 2019-08-26 ENCOUNTER — Encounter (HOSPITAL_COMMUNITY): Payer: Self-pay

## 2019-08-26 ENCOUNTER — Ambulatory Visit (HOSPITAL_COMMUNITY)
Admission: EM | Admit: 2019-08-26 | Discharge: 2019-08-26 | Disposition: A | Discharge status: Home / Self Care | Payer: PRIVATE HEALTH INSURANCE | Attending: Emergency Medicine | Admitting: Emergency Medicine

## 2019-08-26 ENCOUNTER — Other Ambulatory Visit: Payer: Self-pay

## 2019-08-26 DIAGNOSIS — Z87442 Personal history of urinary calculi: Secondary | ICD-10-CM | POA: Insufficient documentation

## 2019-08-26 DIAGNOSIS — J029 Acute pharyngitis, unspecified: Secondary | ICD-10-CM | POA: Diagnosis not present

## 2019-08-26 DIAGNOSIS — R030 Elevated blood-pressure reading, without diagnosis of hypertension: Secondary | ICD-10-CM | POA: Diagnosis not present

## 2019-08-26 DIAGNOSIS — Z791 Long term (current) use of non-steroidal anti-inflammatories (NSAID): Secondary | ICD-10-CM | POA: Insufficient documentation

## 2019-08-26 DIAGNOSIS — Z20828 Contact with and (suspected) exposure to other viral communicable diseases: Secondary | ICD-10-CM | POA: Insufficient documentation

## 2019-08-26 DIAGNOSIS — M542 Cervicalgia: Secondary | ICD-10-CM | POA: Insufficient documentation

## 2019-08-26 DIAGNOSIS — Z79899 Other long term (current) drug therapy: Secondary | ICD-10-CM | POA: Insufficient documentation

## 2019-08-26 DIAGNOSIS — H9209 Otalgia, unspecified ear: Secondary | ICD-10-CM | POA: Insufficient documentation

## 2019-08-26 DIAGNOSIS — Z7984 Long term (current) use of oral hypoglycemic drugs: Secondary | ICD-10-CM | POA: Insufficient documentation

## 2019-08-26 LAB — POCT RAPID STREP A: Streptococcus, Group A Screen (Direct): NEGATIVE

## 2019-08-26 NOTE — Discharge Instructions (Addendum)
Your rapid strep test is negative.  A throat culture is pending; we will call you if it is positive requiring treatment.    Your COVID test is pending.  You should self quarantine until your test result is back and is negative.    Go to the emergency department if you develop high fever, shortness of breath, severe diarrhea, or other concerning symptoms.    Your blood pressure is elevated today at 131/96.  Please have this rechecked by your primary care provider in 2-4 weeks.  If you do not have a primary care provider, one is suggested below.

## 2019-08-26 NOTE — ED Provider Notes (Signed)
[facility]    CSN: [id number] Arrival date & time: 08/26/19  1324      History   Chief Complaint Chief Complaint  Patient presents with  . Sore Throat  . Otalgia    HPI Olivia Le is a 28 y.o. female.   Patient presents with 10-day history of sore throat.  She also reports the pain in her throat radiates up into her ears and down her neck.  She has been treating the pain at home with OTC Tylenol and ibuprofen.  She denies fever, chills, difficulty swallowing, cough, shortness of breath, vomiting, diarrhea, rash, or other symptoms.  Patient reports she had a tonsillectomy 1 year ago.  The history is provided by the patient.    Past Medical History:  Diagnosis Date  . Kidney stone   . Polycystic disease, ovaries     There are no active problems to display for this patient.   Past Surgical History:  Procedure Laterality Date  . TONSILLECTOMY      OB History   No obstetric history on file.      Home Medications    Prior to Admission medications   Medication Sig Start Date End Date Taking? Authorizing Provider  venlafaxine XR (EFFEXOR-XR) 150 MG 24 hr capsule TAKE 1 CAPSULE BY MOUTH EVERY DAY IN THE MORNING. 05/09/19  Yes [provider]  meloxicam (MOBIC) 7.5 MG tablet Take 1 tablet (7.5 mg total) by mouth daily. 07/08/19   Wurst, [place], PA-C  metFORMIN (GLUCOPHAGE-XR) 750 MG 24 hr tablet Take 750 mg by mouth daily with breakfast.    [provider]  methocarbamol (ROBAXIN) 500 MG tablet Take 1 tablet (500 mg total) by mouth 2 (two) times daily. 07/08/19   Wurst, [place], PA-C  naproxen sodium (ALEVE) 220 MG tablet Take 2 tablets every 12 hours until stone passes. 02/04/13   Molpus, John, MD  spironolactone (ALDACTONE) 100 MG tablet Take 200 mg by mouth daily.    [provider]    Family History Family History  Problem Relation Age of Onset  . Hypertension Mother   . Healthy Father     Social History Social History    Tobacco Use  . Smoking status: Never Smoker  . Smokeless tobacco: Never Used  Substance Use Topics  . Alcohol use: Yes    Comment: weekly  . Drug use: No     Allergies   Patient has no known allergies.   Review of Systems Review of Systems  Constitutional: Negative for chills and fever.  HENT: Positive for ear pain and sore throat.   Eyes: Negative for pain and visual disturbance.  Respiratory: Negative for cough and shortness of breath.   Cardiovascular: Negative for chest pain and palpitations.  Gastrointestinal: Negative for abdominal pain and vomiting.  Genitourinary: Negative for dysuria and hematuria.  Musculoskeletal: Negative for arthralgias and back pain.  Skin: Negative for color change and rash.  Neurological: Negative for seizures and syncope.  All other systems reviewed and are negative.    Physical Exam Triage Vital Signs ED Triage Vitals  Enc Vitals Group     BP      Pulse      Resp      Temp      Temp src      SpO2      Weight      Height      Head Circumference      Peak Flow      Pain  Score      Pain Loc      Pain Edu?      Excl. in [place]?    No data found.  Updated Vital Signs BP (!) 131/96 (BP Location: Left Arm)   Pulse 94   Temp 99.5 F (37.5 C) (Oral)   Resp 16   SpO2 98%   Visual Acuity Right Eye Distance:   Left Eye Distance:   Bilateral Distance:    Right Eye Near:   Left Eye Near:    Bilateral Near:     Physical Exam Vitals signs and nursing note reviewed.  Constitutional:      General: She is not in acute distress.    Appearance: She is well-developed.  HENT:     Head: Normocephalic and atraumatic.     Right Ear: Tympanic membrane and ear canal normal.     Left Ear: Tympanic membrane and ear canal normal.     Nose: Nose normal.     Mouth/Throat:     Mouth: Mucous membranes are moist.     Pharynx: Posterior oropharyngeal erythema present. No oropharyngeal exudate.  Eyes:     Conjunctiva/sclera: Conjunctivae  normal.  Neck:     Musculoskeletal: Neck supple.  Cardiovascular:     Rate and Rhythm: Normal rate and regular rhythm.     Heart sounds: No murmur.  Pulmonary:     Effort: Pulmonary effort is normal. No respiratory distress.     Breath sounds: Normal breath sounds.  Abdominal:     General: Bowel sounds are normal.     Palpations: Abdomen is soft.     Tenderness: There is no abdominal tenderness. There is no guarding or rebound.  Skin:    General: Skin is warm and dry.     Findings: No rash.  Neurological:     General: No focal deficit present.     Mental Status: She is alert and oriented to person, place, and time.      UC Treatments / Results  Labs (all labs ordered are listed, but only abnormal results are displayed) Labs Reviewed  NOVEL CORONAVIRUS, NAA (HOSP ORDER, SEND-OUT TO REF LAB; TAT 18-24 HRS)  CULTURE, GROUP A STREP [facility])  POCT RAPID STREP A    EKG   Radiology No results found.  Procedures Procedures (including critical care time)  Medications Ordered in UC Medications - No data to display  Initial Impression / Assessment and Plan / UC Course  I have reviewed the triage vital signs and the nursing notes.  Pertinent labs & imaging results that were available during my care of the patient were reviewed by me and considered in my medical decision making (see chart for details).    Acute pharyngitis.  Elevated blood pressure reading.  Rapid strep negative.  Throat culture pending.  COVID test performed here.  Instructed patient to self quarantine until the test result is back.  Instructed patient to go to the emergency department if develops high fever, shortness of breath, severe diarrhea, or other concerning symptoms.  Discussed with patient that her blood pressure is elevated today and needs to be rechecked by her PCP in 2 to 4 weeks.  Patient agrees with plan of care.   Final Clinical Impressions(s) / UC Diagnoses   Final diagnoses:  Elevated blood  pressure reading  Acute pharyngitis, unspecified etiology     Discharge Instructions     Your rapid strep test is negative.  A throat culture is pending; we will call  you if it is positive requiring treatment.    Your COVID test is pending.  You should self quarantine until your test result is back and is negative.    Go to the emergency department if you develop high fever, shortness of breath, severe diarrhea, or other concerning symptoms.    Your blood pressure is elevated today at 131/96.  Please have this rechecked by your primary care provider in 2-4 weeks.  If you do not have a primary care provider, one is suggested below.       ED Prescriptions    None     PDMP not reviewed this encounter.   Mickie Bailate, Melyssa Signor H, NP 08/26/19 1426

## 2019-08-26 NOTE — ED Triage Notes (Signed)
Patient presents to Urgent Care with complaints of sore throat and sharp pain that starts in her ears and radiates down her neck since 10 daysago. Patient reports she thinks she has strep but would like to be tested for COVID too.

## 2019-08-28 LAB — NOVEL CORONAVIRUS, NAA (HOSP ORDER, SEND-OUT TO REF LAB; TAT 18-24 HRS): SARS-CoV-2, NAA: NOT DETECTED

## 2019-08-28 LAB — CULTURE, GROUP A STREP (THRC)

## 2020-03-28 ENCOUNTER — Encounter (HOSPITAL_COMMUNITY): Payer: Self-pay | Admitting: Psychiatry

## 2020-03-28 ENCOUNTER — Inpatient Hospital Stay (HOSPITAL_COMMUNITY)
Admission: AD | Admit: 2020-03-28 | Discharge: 2020-03-31 | DRG: 885 | Disposition: A | Discharge status: Home / Self Care | Payer: Federal, State, Local not specified - Other | Attending: Psychiatry | Admitting: Psychiatry

## 2020-03-28 ENCOUNTER — Other Ambulatory Visit: Payer: Self-pay

## 2020-03-28 DIAGNOSIS — Z560 Unemployment, unspecified: Secondary | ICD-10-CM

## 2020-03-28 DIAGNOSIS — Z7289 Other problems related to lifestyle: Secondary | ICD-10-CM

## 2020-03-28 DIAGNOSIS — Z20822 Contact with and (suspected) exposure to covid-19: Secondary | ICD-10-CM | POA: Diagnosis present

## 2020-03-28 DIAGNOSIS — F1290 Cannabis use, unspecified, uncomplicated: Secondary | ICD-10-CM | POA: Diagnosis present

## 2020-03-28 DIAGNOSIS — G4700 Insomnia, unspecified: Secondary | ICD-10-CM | POA: Diagnosis present

## 2020-03-28 DIAGNOSIS — E282 Polycystic ovarian syndrome: Secondary | ICD-10-CM | POA: Diagnosis present

## 2020-03-28 DIAGNOSIS — Z7984 Long term (current) use of oral hypoglycemic drugs: Secondary | ICD-10-CM

## 2020-03-28 DIAGNOSIS — Z716 Tobacco abuse counseling: Secondary | ICD-10-CM | POA: Diagnosis not present

## 2020-03-28 DIAGNOSIS — F322 Major depressive disorder, single episode, severe without psychotic features: Principal | ICD-10-CM | POA: Diagnosis present

## 2020-03-28 DIAGNOSIS — R45851 Suicidal ideations: Secondary | ICD-10-CM | POA: Diagnosis present

## 2020-03-28 DIAGNOSIS — Z818 Family history of other mental and behavioral disorders: Secondary | ICD-10-CM | POA: Diagnosis not present

## 2020-03-28 DIAGNOSIS — F17210 Nicotine dependence, cigarettes, uncomplicated: Secondary | ICD-10-CM | POA: Diagnosis present

## 2020-03-28 DIAGNOSIS — F419 Anxiety disorder, unspecified: Secondary | ICD-10-CM | POA: Diagnosis present

## 2020-03-28 DIAGNOSIS — Z79899 Other long term (current) drug therapy: Secondary | ICD-10-CM

## 2020-03-28 DIAGNOSIS — Y907 Blood alcohol level of 200-239 mg/100 ml: Secondary | ICD-10-CM | POA: Diagnosis present

## 2020-03-28 LAB — COMPREHENSIVE METABOLIC PANEL
ALT: 20 U/L (ref 0–44)
AST: 33 U/L (ref 15–41)
Albumin: 4.2 g/dL (ref 3.5–5.0)
Alkaline Phosphatase: 70 U/L (ref 38–126)
Anion gap: 9 (ref 5–15)
BUN: 12 mg/dL (ref 6–20)
CO2: 27 mmol/L (ref 22–32)
Calcium: 8.2 mg/dL — ABNORMAL LOW (ref 8.9–10.3)
Chloride: 107 mmol/L (ref 98–111)
Creatinine, Ser: 0.73 mg/dL (ref 0.44–1.00)
GFR calc Af Amer: >60 mL/min (ref >60)
GFR calc non Af Amer: >60 mL/min (ref >60)
Glucose, Bld: 102 mg/dL — ABNORMAL HIGH (ref 70–99)
Potassium: 4.3 mmol/L (ref 3.5–5.1)
Sodium: 143 mmol/L (ref 135–145)
Total Bilirubin: 0.3 mg/dL (ref 0.3–1.2)
Total Protein: 7.3 g/dL (ref 6.5–8.1)

## 2020-03-28 LAB — CBC WITH DIFFERENTIAL/PLATELET
Abs Immature Granulocytes: 0.12 10*3/uL — ABNORMAL HIGH (ref 0.00–0.07)
Basophils Absolute: 0.1 10*3/uL (ref 0.0–0.1)
Basophils Relative: 1 %
Eosinophils Absolute: 0 10*3/uL (ref 0.0–0.5)
Eosinophils Relative: 0 %
HCT: 39.5 % (ref 36.0–46.0)
Hemoglobin: 12.6 g/dL (ref 12.0–15.0)
Immature Granulocytes: 1 %
Lymphocytes Relative: 32 %
Lymphs Abs: 3.5 10*3/uL (ref 0.7–4.0)
MCH: 28.9 pg (ref 26.0–34.0)
MCHC: 31.9 g/dL (ref 30.0–36.0)
MCV: 90.6 fL (ref 80.0–100.0)
Monocytes Absolute: 0.7 10*3/uL (ref 0.1–1.0)
Monocytes Relative: 7 %
Neutro Abs: 6.4 10*3/uL (ref 1.7–7.7)
Neutrophils Relative %: 59 %
Platelets: 356 10*3/uL (ref 150–400)
RBC: 4.36 MIL/uL (ref 3.87–5.11)
RDW: 14.4 % (ref 11.5–15.5)
WBC: 10.8 10*3/uL — ABNORMAL HIGH (ref 4.0–10.5)
nRBC: 0 % (ref 0.0–0.2)

## 2020-03-28 LAB — RAPID URINE DRUG SCREEN, HOSP PERFORMED
Amphetamines: NOT DETECTED
Barbiturates: NOT DETECTED
Benzodiazepines: NOT DETECTED
Cocaine: NOT DETECTED
Opiates: NOT DETECTED
Tetrahydrocannabinol: POSITIVE — AB

## 2020-03-28 LAB — URINALYSIS, ROUTINE W REFLEX MICROSCOPIC
Bilirubin Urine: NEGATIVE
Glucose, UA: NEGATIVE mg/dL
Hgb urine dipstick: NEGATIVE
Ketones, ur: NEGATIVE mg/dL
Leukocytes,Ua: NEGATIVE
Nitrite: NEGATIVE
Protein, ur: NEGATIVE mg/dL
Specific Gravity, Urine: 1.018 (ref 1.005–1.030)
pH: 6 (ref 5.0–8.0)

## 2020-03-28 LAB — PREGNANCY, URINE: Preg Test, Ur: NEGATIVE

## 2020-03-28 LAB — ACETAMINOPHEN LEVEL: Acetaminophen (Tylenol), Serum: <10 ug/mL — ABNORMAL LOW (ref 10–30)

## 2020-03-28 LAB — SALICYLATE LEVEL: Salicylate Lvl: <7 mg/dL — ABNORMAL LOW (ref 7.0–30.0)

## 2020-03-28 LAB — SARS CORONAVIRUS 2 BY RT PCR (HOSPITAL ORDER, PERFORMED IN ~~LOC~~ HOSPITAL LAB): SARS Coronavirus 2: NEGATIVE

## 2020-03-28 LAB — ETHANOL: Alcohol, Ethyl (B): 211 mg/dL — ABNORMAL HIGH (ref <10)

## 2020-03-28 MED ORDER — MAGNESIUM HYDROXIDE 400 MG/5ML PO SUSP: 30.0000 mL | Freq: Every day | ORAL | Status: DC | PRN

## 2020-03-28 MED ORDER — ALUM & MAG HYDROXIDE-SIMETH 200-200-20 MG/5ML PO SUSP: 30.0000 mL | ORAL | Status: DC | PRN

## 2020-03-28 MED ORDER — ACETAMINOPHEN 325 MG PO TABS
650.0000 mg | ORAL_TABLET | Freq: Four times a day (QID) | ORAL | Status: DC | PRN
  Administered 2020-03-29 – 2020-03-30 (×2): 650 mg via ORAL
  Filled 2020-03-28 (×2): qty 2

## 2020-03-28 MED ORDER — HYDROXYZINE HCL 25 MG PO TABS
25.0000 mg | ORAL_TABLET | Freq: Once | ORAL | Status: AC
  Administered 2020-03-28: 25 mg via ORAL
  Filled 2020-03-28: qty 1

## 2020-03-28 MED ORDER — ESCITALOPRAM OXALATE 5 MG PO TABS
5.0000 mg | ORAL_TABLET | Freq: Every day | ORAL | Status: DC
  Administered 2020-03-28 – 2020-03-30 (×3): 5 mg via ORAL
  Filled 2020-03-28 (×4): qty 1

## 2020-03-28 NOTE — Progress Notes (Addendum)
   03/28/20 2335  Psych Admission Type (Psych Patients Only)  Admission Status Voluntary  Psychosocial Assessment  Patient Complaints Anxiety  Eye Contact Fair  Facial Expression Anxious  Affect Anxious  Speech Logical/coherent  Interaction Assertive  Motor Activity Other (Comment) (WNL)  Appearance/Hygiene Unremarkable  Behavior Characteristics Cooperative  Mood Anxious;Depressed  Thought Process  Coherency WDL  Content WDL  Delusions None reported or observed  Perception WDL  Hallucination None reported or observed  Judgment Poor  Confusion None  Danger to Self  Current suicidal ideation? Passive  Self-Injurious Behavior Some self-injurious ideation observed or expressed.  No lethal plan expressed   Agreement Not to Harm Self Yes  Description of Agreement verbal  Danger to Others  Danger to Others None reported or observed   Pt transferred from OBS to 300-2. Pt endorses suicidal thoughts but agrees to come to staff if her thoughts lead to action. Pt very anxious. This is her [facility] admission at [facility]. Pt given ginger ale to drink and food but couldn't eat d/t the anxiety. Pt given Vistaril 25 mg on OBS unit before transfer.
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
## 2020-03-28 NOTE — Progress Notes (Signed)
Patient ID: Olivia Le, female   DOB: 02-Jan-1991, 29 y.o.   MRN: [id number] Pt anxious and irritated about being on the unit. Reassurance given to pt.  Pt cooperative, Denies SI, HI or AVH at present, monitoring for safety.  Pending transfer to [address], [address].

## 2020-03-28 NOTE — H&P (Signed)
Behavioral Health Medical Screening Exam  Olivia Le is an 29 y.o. female presents with active suicidal ideations and "feeling down". Per patient she recently came out to her family and she is now being shunned upon. She states she has been feeling suicidal for several days and today she came very close too it today. She states she had a plan to drown herself at the pool or slit her wrist. She describes her depressive symptoms as sadness, hopeless, worthless, insomnia, lack of appetite. She recently separated from her girlfriend due to the disagreement of their relationship. She also reports she was laid off of work and is unemployed at this time. She states she has been seeking care at [facility] where she was receiving counseling however she wanted medications as well. Since she was is now unemployed her therapy session cost $300 per session and she can not afford it. Patient is observed to be cheerful and smiling, despite her suicidal thoughts. When I asked her about her behavior she stated "I am a very happy person but don't let this mistake you. I am also very down and have never felt this low. " She reports she told her mom about the suicidal thoughts and "asked her if she wanted a gay daughter or a dead daughter." SHe denies any illicit substances, and expresses occasional use of alcohol last had a drink at 12pm today. She denies previous suicide attempt, thoughts or gestures. She denies any previous inpatient admission. She is requesting crisis stabilization and medication management.   Per Roderic Scarce patient mother, Judeen has had increasing anxiety and depression. 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 She reports that the patient has been seeing a therapist for the last 6-7weeks due  to stressors. She notes that a few weeks ago Merelin informed the family that she was a homosexual. She reports that the family was not accepting of her lifestyle. Since that time, she notes that Khaliah informed her best friend that she was feeling broken. Her friend then called her mother who picked the patient up. She notes that when she picked the patient up she was drunk. Before bringing her in she notes that she let her sober up.   Total Time spent with patient: 30 minutes  Psychiatric Specialty Exam: Physical Exam  Nursing note and vitals reviewed. Constitutional: She appears well-developed and well-nourished. No distress.  HENT:  Head: Normocephalic.  Eyes: Pupils are equal, round, and reactive to light.  Musculoskeletal:     Cervical back: Normal range of motion.  Skin: She is not diaphoretic.    Review of Systems  Psychiatric/Behavioral: Positive for confusion, dysphoric mood, sleep disturbance and suicidal ideas. Negative for agitation, behavioral problems, decreased concentration and hallucinations. The patient is nervous/anxious. The patient is not hyperactive.   All other systems reviewed and are negative.   There were no vitals taken for this visit.There is no height or weight on file to calculate BMI.  General Appearance: Fairly Groomed  Eye Contact:  Good  Speech:  Clear and Coherent and Normal Rate  Volume:  Normal  Mood:  Depressed, Hopeless and Worthless  Affect:  Non-Congruent and smiling and rocking  Thought Process:  Coherent, Linear and Descriptions of Associations: Circumstantial  Orientation:  Full (Time, Place, and Person)  Thought Content:  Logical and  Rumination  Suicidal Thoughts:  Yes.  with intent/plan  Homicidal Thoughts:  No  Memory:  Immediate;   Fair Recent;   Fair Remote;   Fair  Judgement:  Fair  Insight:  Fair  Psychomotor Activity:  Normal and Rocking  Concentration: Concentration: Good and Attention Span: Good  Recall:  Good  Fund of  Knowledge:Good  Language: Good  Akathisia:  No  Handed:  Right  AIMS (if indicated):     Assets:  Communication Skills Desire for Improvement Housing Leisure Time Physical Health Resilience  Sleep:       Musculoskeletal: Strength & Muscle Tone: within normal limits Gait & Station: normal Patient leans: N/A  There were no vitals taken for this visit.  Recommendations:  Based on my evaluation the patient does not appear to have an emergency medical condition. Will recommend inpatient admission at this time. WIll obtain pre-covid screening, and routine baseline inpatient labs. WIll start low dose escitalopram 5mg  po daily. Under review at [facility], pending negative Covid.   Suella Broad, FNP 03/28/2020, 4:30 PM

## 2020-03-28 NOTE — Progress Notes (Signed)
   03/28/20 1800  Psych Admission Type (Psych Patients Only)  Admission Status Voluntary  Psychosocial Assessment  Patient Complaints Anxiety;Self-harm thoughts  Eye Contact Brief  Facial Expression Anxious  Affect Labile;Silly;Sad  Speech Rapid;Pressured  Interaction Assertive  Motor Activity Other (Comment) (WNL)  Appearance/Hygiene Unremarkable  Behavior Characteristics Cooperative  Mood Depressed  Thought Process  Coherency WDL  Content WDL  Delusions None reported or observed  Perception WDL  Hallucination None reported or observed  Judgment Poor  Confusion None  Danger to Self  Current suicidal ideation? Denies  Danger to Others  Danger to Others None reported or observed

## 2020-03-29 ENCOUNTER — Encounter (HOSPITAL_COMMUNITY): Payer: Self-pay | Admitting: Psychiatry

## 2020-03-29 DIAGNOSIS — F322 Major depressive disorder, single episode, severe without psychotic features: Principal | ICD-10-CM

## 2020-03-29 LAB — LIPID PANEL
Cholesterol: 149 mg/dL (ref 0–200)
HDL: 97 mg/dL (ref >40)
LDL Cholesterol: 46 mg/dL (ref 0–99)
Total CHOL/HDL Ratio: 1.5 RATIO
Triglycerides: 29 mg/dL (ref <150)
VLDL: 6 mg/dL (ref 0–40)

## 2020-03-29 LAB — TSH: TSH: 2.297 u[IU]/mL (ref 0.350–4.500)

## 2020-03-29 LAB — HEMOGLOBIN A1C
Hgb A1c MFr Bld: 4.9 % (ref 4.8–5.6)
Mean Plasma Glucose: 93.93 mg/dL

## 2020-03-29 MED ORDER — NICOTINE 7 MG/24HR TD PT24
7.0000 mg | MEDICATED_PATCH | Freq: Every day | TRANSDERMAL | Status: DC
  Administered 2020-03-29 – 2020-03-31 (×3): 7 mg via TRANSDERMAL
  Filled 2020-03-29 (×5): qty 1

## 2020-03-29 MED ORDER — ONDANSETRON 4 MG PO TBDP: 4.0000 mg | ORAL_TABLET | Freq: Four times a day (QID) | ORAL | Status: DC | PRN

## 2020-03-29 MED ORDER — ADULT MULTIVITAMIN W/MINERALS CH
1.0000 | ORAL_TABLET | Freq: Every day | ORAL | Status: DC
  Administered 2020-03-29 – 2020-03-31 (×3): 1 via ORAL
  Filled 2020-03-29 (×5): qty 1

## 2020-03-29 MED ORDER — THIAMINE HCL 100 MG PO TABS
100.0000 mg | ORAL_TABLET | Freq: Every day | ORAL | Status: DC
  Administered 2020-03-30 – 2020-03-31 (×2): 100 mg via ORAL
  Filled 2020-03-29 (×4): qty 1

## 2020-03-29 MED ORDER — HYDROXYZINE HCL 25 MG PO TABS
25.0000 mg | ORAL_TABLET | Freq: Three times a day (TID) | ORAL | Status: DC | PRN
  Administered 2020-03-29: 25 mg via ORAL
  Filled 2020-03-29 (×2): qty 1

## 2020-03-29 MED ORDER — LOPERAMIDE HCL 2 MG PO CAPS
2.0000 mg | ORAL_CAPSULE | ORAL | Status: DC | PRN
Start: 2020-03-29 — End: 2020-03-29

## 2020-03-29 MED ORDER — LORAZEPAM 1 MG PO TABS
1.0000 mg | ORAL_TABLET | Freq: Four times a day (QID) | ORAL | Status: DC | PRN
  Administered 2020-03-29 (×2): 1 mg via ORAL
  Filled 2020-03-29 (×3): qty 1

## 2020-03-29 MED ORDER — GABAPENTIN 100 MG PO CAPS
100.0000 mg | ORAL_CAPSULE | Freq: Three times a day (TID) | ORAL | Status: DC
  Administered 2020-03-29 – 2020-03-31 (×7): 100 mg via ORAL
  Filled 2020-03-29 (×13): qty 1

## 2020-03-29 MED ORDER — HYDROXYZINE HCL 25 MG PO TABS
25.0000 mg | ORAL_TABLET | Freq: Four times a day (QID) | ORAL | Status: DC | PRN
  Administered 2020-03-29 – 2020-03-30 (×3): 25 mg via ORAL
  Filled 2020-03-29 (×3): qty 1

## 2020-03-29 MED ORDER — THIAMINE HCL 100 MG/ML IJ SOLN: 100.0000 mg | Freq: Once | INTRAMUSCULAR | Status: DC

## 2020-03-29 NOTE — Progress Notes (Signed)
[facility] Group Notes:  (Nursing/MHT/Case Management/Adjunct)  Date:  03/29/2020  Time:  2030 Type of Therapy:  wrap up group  Participation Level:  Active  Participation Quality:  Appropriate, Attentive, Sharing and Supportive  Affect:  Appropriate  Cognitive:  Appropriate  Insight:  Improving  Engagement in Group:  Engaged  Modes of Intervention:  Clarification, Education and Support  Summary of Progress/Problems: Positive thinking and positive change were discussed. Pt reports being grateful for unconditional love from family even when she is reckless. Pt states she wants to quit alcohol and nicotine.   Olivia Le 03/29/2020, 8:58 PM

## 2020-03-29 NOTE — Progress Notes (Signed)
Pt up and out in the alcove near the nurse's station. Pt still very anxious. Rates anxiety 9/10. Hands visibly shaking. Patient given Vistaril 25 mg and ginger ale to settle her stomach. Will continue to monitor.

## 2020-03-29 NOTE — [facility] Counselor (Signed)
Adult Comprehensive Assessment  Patient ID: Olivia Le, female   DOB: 12/23/1990, 29 y.o.   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" Family Relationships: "Shame, don't want to be a burdenEngineer, petroleum / Lack of resources (include bankruptcy): "The unknown, because I work on Visual merchandiser / Lack of housing: Denies Physical health (include injuries & life threatening diseases): Denies Social relationships: Denies Substance abuse: Alcohol Bereavement / Loss: Denies  Living/Environment/Situation:  Living Arrangements: Non-relatives/Friends Living conditions (as described by patient or guardian): "Good, the quality is good" Who else lives in the home?: Roommate How long has patient lived in current situation?: 3 years What is atmosphere in current home: Supportive, Comfortable  Family History:  Marital status: Single Are you sexually active?: Yes What is your sexual orientation?: Bisexual Has your sexual activity been affected by drugs, alcohol, medication, or emotional stress?: Denies Does patient have children?: No  Childhood History:  By whom was/is the patient raised?: Both parents Additional childhood history information: "Pretty good, had a lot of anxiety" Description of patient's relationship with caregiver when they were a child: "good" Patient's description of current relationship with people who raised him/her: "Pretty good, finally being open" How were you disciplined when you got in trouble as a child/adolescent?: "Spanked" Does patient have siblings?: Yes(2  older brothers) Number of Siblings: 2 Description of patient's current relationship with siblings: "Very good" Did patient suffer any verbal/emotional/physical/sexual abuse as a child?: No Did patient suffer from severe childhood neglect?: No Has patient ever been sexually abused/assaulted/raped as an adolescent or adult?: No Was the patient ever a victim of a crime or a disaster?: No Witnessed domestic violence?: No Has patient been effected by domestic violence as an adult?: No  Education:  Highest grade of school patient has completed: Probation officer Currently a Consulting civil engineer?: No Learning disability?: No  Employment/Work Situation:   Employment situation: Employed 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?: No Are There Guns or Other Weapons in Your Home?: No  Financial Resources:   Financial resources: Income from employment Does patient have a representative payee or guardian?: No  Alcohol/Substance Abuse:   What has been your use of drugs/alcohol within the last 12 months?: Reports drinking heavily 3-4 times a week to "cope" If attempted suicide, did drugs/alcohol play a role in this?: No Alcohol/Substance Abuse Treatment Hx: Denies past history Has alcohol/substance abuse ever caused legal problems?: No  Social Support System:   Conservation officer, nature Support System: Good Describe Community Support System: "My siblings, my parents, my coworkers and friends" Type of faith/religion: "Christian" How does patient's faith help to cope with current illness?: "Wished it helped more, doesn't really  help right now"  Leisure/Recreation:   Leisure and Hobbies: "Love to be active, play guitar, cook, host  events"  Strengths/Needs:   What is the patient's perception of their strengths?: "Problem solver, high energy, commited, socially confident" Patient states they can use these personal strengths during their treatment to contribute to their recovery: "Know who I am and that I have value" Patient states these barriers may affect/interfere with their treatment: "Get in my head way to much" Patient states these barriers may affect their return to the community: Denies Other important information patient would like considered in planning for their treatment: Communication  Discharge Plan:   Currently receiving community mental health services: Yes (From Whom)(Was receiving medicaiton management at [facility] and therapy with Vallery Sa, but can no longer afford it due to no insurance) Patient states concerns and preferences for aftercare planning are: Would like to switch providers, sliding-scale Patient states they will know when they are safe and ready for discharge when: "Yes, I need to be stable on meds first" Does patient have access to transportation?: Yes(Mother) Does patient have financial barriers related to discharge medications?: Yes Patient description of barriers related to discharge medications: No insurance Will patient be returning to same living situation after discharge?: Yes  Summary/Recommendations:   Summary and Recommendations (to be completed by the evaluator): Olivia Le is a 29y.o. female who has been admitted to [facility] due to active suicidal ideations and "feeling down". Per patient she recently came out to her family and she is now being shunned upon. She states she has been feeling suicidal for several days and today she came very close too it today. She states she had a plan to drown herself at the pool or slit her wrist. She  describes her depressive symptoms as sadness, hopeless, worthless, insomnia, lack of appetite. She recently separated from her girlfriend due to the disagreement of their relationship. She also reports she was laid off of work and is unemployed at this time. She states she has been seeking care at [facility] where she was receiving counseling however she wanted medications as well. Since she was is now unemployed her therapy session cost $300 per session and she can not afford it.While here, Gordon Carlson can benefit from crisis stabilization, medication management, therapeutic milieu, and referrals for services  [place]. 03/29/2020

## 2020-03-29 NOTE — Progress Notes (Addendum)
   03/29/20 2120  Psych Admission Type (Psych Patients Only)  Admission Status Voluntary  Psychosocial Assessment  Patient Complaints Anxiety;Depression  Eye Contact Fair  Facial Expression Anxious  Affect Anxious  Speech Logical/coherent  Interaction Assertive  Motor Activity Other (Comment) (WNL)  Appearance/Hygiene Unremarkable  Behavior Characteristics Cooperative;Anxious  Mood Depressed;Anxious  Thought Process  Coherency WDL  Content WDL  Delusions None reported or observed  Perception WDL  Hallucination None reported or observed  Judgment Poor  Confusion None  Danger to Self  Current suicidal ideation? Denies  Danger to Others  Danger to Others None reported or observed   Pt rates anxiety 7/10 and still endorses tremors. States that appetite has increased and nausea has subsided.

## 2020-03-29 NOTE — H&P (Addendum)
Psychiatric Admission Assessment Adult  Patient Identification: Olivia Le  MRN:  [id number]  Date of Evaluation:  03/29/2020  Chief Complaint:  MDD (major depressive disorder), single episode, severe ([place]) [F32.2]  Principal Diagnosis: MDD (major depressive disorder), single episode, severe ([place])  Diagnosis:  Principal Problem:   MDD (major depressive disorder), single episode, severe ([place])  History of Present Illness: (Per Md's admission SRA notes): 29 year old female, single, no children, lives with parents. 29 year old female, presented on 03/28/20 reporting depression with thoughts of drowning herself or cutting her wrists.  Endorsed neurovegetative symptoms to include low energy, poor sleep, decreased appetite, anhedonia.  In addition to depression also describes increased anxiety recently.  She also reported increased frequency of alcohol consumption. Reports she drinks 2-3 times a week on average up to 4 drinks per sitting.  (Denies any history of seizures or DTs, does sometimes feel "shaky" on days following alcohol consumption).  She denies any drug abuse.  Admission BAL 211, UDS positive for cannabis. Attributes depression in part to strained family relationship.  She had recently informed her parents about her sexual orientation and they did not take it well.  She also lost her job recently. No prior psychiatric admissions.  No history of suicide attempts.  No history of psychosis. Does not endorse history of PTSD.  No clear history of mania or hypomania.  Endorses  history of depression and anxiety.  She had been treated with Effexor XR in the past but has not been on any medication for about a year. Denies medical illnesses, NKDA. Currently presents with slight distal tremors and feels subjectively flushed.  No headache or visual disturbances at this time.  Vitals are stable BP 121/84, pulse 95. Diagnosis-MDD without psychotic features versus alcohol induced mood  disorder. Plan-inpatient admission\We discussed treatment options, agrees to antidepressant trial. Prefers to try a new antidepressant rather than return to Effexor XR which she had taken in the past.  Has been started on Lexapro- side effects reviewed.  She was also started on Neurontin for anxiety , currently 800 mg 3 times daily. Ativan as needed for alcohol withdrawal as per CIWA protocol.  Associated Signs/Symptoms:  Depression Symptoms:  depressed mood, insomnia, feelings of worthlessness/guilt, hopelessness, anxiety,  (Hypo) Manic Symptoms:  Labiality of Mood,  Anxiety Symptoms:  Excessive Worry,  Psychotic Symptoms:  Denies any hallucinations, delusions, paranoia.  PTSD Symptoms:  NA  Total Time spent with patient: 1 hour  Past Psychiatric History: Major depressive disorder.  Is the patient at risk to self? No.  Has the patient been a risk to self in the past 6 months? Yes.    Has the patient been a risk to self within the distant past? Yes.    Is the patient a risk to others? No.  Has the patient been a risk to others in the past 6 months? No.  Has the patient been a risk to others within the distant past? No.   Prior Inpatient Therapy: Prior Inpatient Therapy: No  Prior Outpatient Therapy: Prior Outpatient Therapy: Yes Prior Therapy Dates: while in college, currently in outpt tx Prior Therapy Facilty/Provider(s): college counselor, currently with [facility] Reason for Treatment: Depression Does patient have an ACCT team?: No Does patient have Intensive In-House Services?  : No Does patient have [facility] services? : No Does patient have P4CC services?: No  Alcohol Screening: 1. How often do you have a drink containing alcohol?: 2 to 3 times a week 2. How many drinks containing alcohol do you  have on a typical day when you are drinking?: 1 or 2 3. How often do you have six or more drinks on one occasion?: Less than monthly AUDIT-C Score: 4 4. How often during  the last year have you found that you were not able to stop drinking once you had started?: Monthly 5. How often during the last year have you failed to do what was normally expected from you because of drinking?: Never 6. How often during the last year have you needed a first drink in the morning to get yourself going after a heavy drinking session?: Never 7. How often during the last year have you had a feeling of guilt of remorse after drinking?: Less than monthly 8. How often during the last year have you been unable to remember what happened the night before because you had been drinking?: Less than monthly 9. Have you or someone else been injured as a result of your drinking?: No 10. Has a relative or friend or a doctor or another health worker been concerned about your drinking or suggested you cut down?: Yes, but not in the last year Alcohol Use Disorder Identification Test Final Score (AUDIT): 10  Substance Abuse History in the last 12 months:  Yes.    Consequences of Substance Abuse: Discussed with patient during this evaluation. Medical Consequences:  Liver damage, Possible death by overdose Legal Consequences:  Arrests, jail time, Loss of driving privilege. Family Consequences:  Family discord, divorce and or separation.  Previous Psychotropic Medications: Yes (Effexor)  Psychological Evaluations: No   Past Medical History:  Past Medical History:  Diagnosis Date  . Kidney stone   . Polycystic disease, ovaries     Past Surgical History:  Procedure Laterality Date  . TONSILLECTOMY     Family History:  Family History  Problem Relation Age of Onset  . Hypertension Mother   . Healthy Father    Family Psychiatric  History: Major depression: Brother.                                                       Major depression: Mother  Tobacco Screening: Have you used any form of tobacco in the last 30 days? (Cigarettes, Smokeless Tobacco, Cigars, and/or Pipes): Yes Are you  interested in Tobacco Cessation Medications?: Yes, will notify MD for an order Counseled patient on smoking cessation including recognizing danger situations, developing coping skills and basic information about quitting provided: Refused/Declined practical counseling  Social History:  Social History   Substance and Sexual Activity  Alcohol Use Yes   Comment: weekly     Social History   Substance and Sexual Activity  Drug Use No    Additional Social History: Marital status: Single Are you sexually active?: Yes What is your sexual orientation?: Bisexual Has your sexual activity been affected by drugs, alcohol, medication, or emotional stress?: Denies Does patient have children?: No    Pain Medications: None Prescriptions: None Over the Counter: None History of alcohol / drug use?: Yes Longest period of sobriety (when/how long): N/A Name of Substance 1: ETOH 1 - Age of First Use: early 22s 1 - Amount (size/oz): 3-4 drinks 1 - Frequency: 3-4 times per week 1 - Duration: increased use past 3-4 months 1 - Last Use / Amount: today, 3-4 drinks  Allergies:  No Known  Allergies  Lab Results:  Results for orders placed or performed during the hospital encounter of 03/28/20 (from the past 48 hour(s))  SARS Coronavirus 2 by RT PCR (hospital order, performed in [facility] hospital lab) Nasopharyngeal Nasopharyngeal Swab     Status: None   Collection Time: 03/28/20  5:45 PM   Specimen: Nasopharyngeal Swab  Result Value Ref Range   SARS Coronavirus 2 NEGATIVE NEGATIVE    Comment: (NOTE) SARS-CoV-2 target nucleic acids are NOT DETECTED. The SARS-CoV-2 RNA is generally detectable in upper and lower respiratory specimens during the acute phase of infection. The lowest concentration of SARS-CoV-2 viral copies this assay can detect is 250 copies / mL. A negative result does not preclude SARS-CoV-2 infection and should not be used as the sole basis for treatment or other patient  management decisions.  A negative result may occur with improper specimen collection / handling, submission of specimen other than nasopharyngeal swab, presence of viral mutation(s) within the areas targeted by this assay, and inadequate number of viral copies (<250 copies / mL). A negative result must be combined with clinical observations, patient history, and epidemiological information. Fact Sheet for Patients:   StrictlyIdeas.no Fact Sheet for Healthcare Providers: BankingDealers.co.za This test is not yet approved or cleared  by the [place] FDA and has been authorized for detection and/or diagnosis of SARS-CoV-2 by FDA under an Emergency Use Authorization (EUA).  This EUA will remain in effect (meaning this test can be used) for the duration of the COVID-19 declaration under Section 564(b)(1) of the Act, 21 U.S.C. section 360bbb-3(b)(1), unless the authorization is terminated or revoked sooner. Performed at [facility], [place] [address] [place], [place] [address]   Pregnancy, urine     Status: None   Collection Time: 03/28/20  5:45 PM  Result Value Ref Range   Preg Test, Ur NEGATIVE NEGATIVE    Comment:        THE SENSITIVITY OF THIS METHODOLOGY IS >20 mIU/mL. Performed at [facility], [place] [address] [place], [place] [address]   Rapid urine drug screen (hospital performed)     Status: Abnormal   Collection Time: 03/28/20  5:45 PM  Result Value Ref Range   Opiates NONE DETECTED NONE DETECTED   Cocaine NONE DETECTED NONE DETECTED   Benzodiazepines NONE DETECTED NONE DETECTED   Amphetamines NONE DETECTED NONE DETECTED   Tetrahydrocannabinol POSITIVE (A) NONE DETECTED   Barbiturates NONE DETECTED NONE DETECTED    Comment: (NOTE) DRUG SCREEN FOR MEDICAL PURPOSES ONLY.  IF CONFIRMATION IS NEEDED FOR ANY PURPOSE, NOTIFY LAB WITHIN 5 DAYS. LOWEST DETECTABLE LIMITS FOR URINE DRUG  SCREEN Drug Class                     Cutoff (ng/mL) Amphetamine and metabolites    1000 Barbiturate and metabolites    200 Benzodiazepine                 546 Tricyclics and metabolites     300 Opiates and metabolites        300 Cocaine and metabolites        300 THC                            50 Performed at [facility], [place] [address], [place], [place] [address]   Urinalysis, Routine w reflex microscopic     Status: None   Collection Time:  03/28/20  5:45 PM  Result Value Ref Range   Color, Urine YELLOW YELLOW   APPearance CLEAR CLEAR   Specific Gravity, Urine 1.018 1.005 - 1.030   pH 6.0 5.0 - 8.0   Glucose, UA NEGATIVE NEGATIVE mg/dL   Hgb urine dipstick NEGATIVE NEGATIVE   Bilirubin Urine NEGATIVE NEGATIVE   Ketones, ur NEGATIVE NEGATIVE mg/dL   Protein, ur NEGATIVE NEGATIVE mg/dL   Nitrite NEGATIVE NEGATIVE   Leukocytes,Ua NEGATIVE NEGATIVE    Comment: Performed at [place] [address] [place], [place] [address]  CBC with Differential/Platelet     Status: Abnormal   Collection Time: 03/28/20  6:35 PM  Result Value Ref Range   WBC 10.8 (H) 4.0 - 10.5 K/uL   RBC 4.36 3.87 - 5.11 MIL/uL   Hemoglobin 12.6 12.0 - 15.0 g/dL   HCT 39.5 36.0 - 46.0 %   MCV 90.6 80.0 - 100.0 fL   MCH 28.9 26.0 - 34.0 pg   MCHC 31.9 30.0 - 36.0 g/dL   RDW 14.4 11.5 - 15.5 %   Platelets 356 150 - 400 K/uL   nRBC 0.0 0.0 - 0.2 %   Neutrophils Relative % 59 %   Neutro Abs 6.4 1.7 - 7.7 K/uL   Lymphocytes Relative 32 %   Lymphs Abs 3.5 0.7 - 4.0 K/uL   Monocytes Relative 7 %   Monocytes Absolute 0.7 0.1 - 1.0 K/uL   Eosinophils Relative 0 %   Eosinophils Absolute 0.0 0.0 - 0.5 K/uL   Basophils Relative 1 %   Basophils Absolute 0.1 0.0 - 0.1 K/uL   Immature Granulocytes 1 %   Abs Immature Granulocytes 0.12 (H) 0.00 - 0.07 K/uL    Comment: Performed at [facility], [place] [address], [place], [place] [address]  Comprehensive  metabolic panel     Status: Abnormal   Collection Time: 03/28/20  6:35 PM  Result Value Ref Range   Sodium 143 135 - 145 mmol/L   Potassium 4.3 3.5 - 5.1 mmol/L   Chloride 107 98 - 111 mmol/L   CO2 27 22 - 32 mmol/L   Glucose, Bld 102 (H) 70 - 99 mg/dL    Comment: Glucose reference range applies only to samples taken after fasting for at least 8 hours.   BUN 12 6 - 20 mg/dL   Creatinine, Ser 0.73 0.44 - 1.00 mg/dL   Calcium 8.2 (L) 8.9 - 10.3 mg/dL   Total Protein 7.3 6.5 - 8.1 g/dL   Albumin 4.2 3.5 - 5.0 g/dL   AST 33 15 - 41 U/L   ALT 20 0 - 44 U/L   Alkaline Phosphatase 70 38 - 126 U/L   Total Bilirubin 0.3 0.3 - 1.2 mg/dL   GFR calc non Af Amer >60 >60 mL/min   GFR calc Af Amer >60 >60 mL/min   Anion gap 9 5 - 15    Comment: Performed at [facility], [place] [address], [place], [place] [address]  Acetaminophen level     Status: Abnormal   Collection Time: 03/28/20  6:35 PM  Result Value Ref Range   Acetaminophen (Tylenol), Serum <10 (L) 10 - 30 ug/mL    Comment: (NOTE) Therapeutic concentrations vary significantly. A range of 10-30 ug/mL  may be an effective concentration for many patients. However, some  are best treated at concentrations outside of this range. Acetaminophen concentrations >150 ug/mL at 4 hours after ingestion  and >50 ug/mL at 12  hours after ingestion are often associated with  toxic reactions. Performed at [facility], [place] [address] [place], [place] [address]   Salicylate level     Status: Abnormal   Collection Time: 03/28/20  6:35 PM  Result Value Ref Range   Salicylate Lvl <3.2 (L) 7.0 - 30.0 mg/dL    Comment: Performed at [facility], [place] [address], [place], [place] [address]  Ethanol     Status: Abnormal   Collection Time: 03/28/20  6:35 PM  Result Value Ref Range   Alcohol, Ethyl (B) 211 (H) <10 mg/dL    Comment: (NOTE) Lowest detectable limit for serum alcohol is 10 mg/dL. For  medical purposes only. Performed at [facility], [place] [address] [place], [place] [address]   TSH     Status: None   Collection Time: 03/29/20  6:38 AM  Result Value Ref Range   TSH 2.297 0.350 - 4.500 uIU/mL    Comment: Performed by a 3rd Generation assay with a functional sensitivity of <=0.01 uIU/mL. Performed at [facility], [place] [address], [place], [place] [address]   Lipid panel     Status: None   Collection Time: 03/29/20  6:38 AM  Result Value Ref Range   Cholesterol 149 0 - 200 mg/dL   Triglycerides 29 <150 mg/dL   HDL 97 >40 mg/dL   Total CHOL/HDL Ratio 1.5 RATIO   VLDL 6 0 - 40 mg/dL   LDL Cholesterol 46 0 - 99 mg/dL    Comment:        Total Cholesterol/HDL:CHD Risk Coronary Heart Disease Risk Table                     Men   Women  1/2 Average Risk   3.4   3.3  Average Risk       5.0   4.4  2 X Average Risk   9.6   7.1  3 X Average Risk  23.4   11.0        Use the calculated Patient Ratio above and the CHD Risk Table to determine the patient's CHD Risk.        ATP III CLASSIFICATION (LDL):  <100     mg/dL   Optimal  100-129  mg/dL   Near or Above                    Optimal  130-159  mg/dL   Borderline  160-189  mg/dL   High  >190     mg/dL   Very High Performed at [place] [address], [place], [place] [address]   Hemoglobin A1c     Status: None   Collection Time: 03/29/20  6:38 AM  Result Value Ref Range   Hgb A1c MFr Bld 4.9 4.8 - 5.6 %    Comment: (NOTE) Pre diabetes:          5.7%-6.4% Diabetes:              >6.4% Glycemic control for   <7.0% adults with diabetes    Mean Plasma Glucose 93.93 mg/dL    Comment: Performed at [place] [address], [place], [place] [address]   Blood Alcohol level:  Lab Results  Component Value Date   ETH 211 (H) 76/16/0737   Metabolic Disorder Labs:  Lab Results  Component Value Date   HGBA1C 4.9 03/29/2020   MPG 93.93  03/29/2020    No results found for: PROLACTIN Lab Results  Component Value Date   CHOL 149 03/29/2020   TRIG 29 03/29/2020   HDL 97 03/29/2020   CHOLHDL 1.5 03/29/2020   VLDL 6 03/29/2020   LDLCALC 46 03/29/2020   Current Medications: Current Facility-Administered Medications  Medication Dose Route Frequency Provider Last Rate Last Admin  . acetaminophen (TYLENOL) tablet 650 mg  650 mg Oral Q6H PRN Suella Broad, FNP   650 mg at 03/29/20 1761  . escitalopram (LEXAPRO) tablet 5 mg  5 mg Oral QHS Suella Broad, FNP   5 mg at 03/28/20 2209  . gabapentin (NEURONTIN) capsule 100 mg  100 mg Oral TID Lindell Spar I, NP   100 mg at 03/29/20 1416  . hydrOXYzine (ATARAX/VISTARIL) tablet 25 mg  25 mg Oral Q6H PRN Corgan Mormile, Myer Peer, MD      . LORazepam (ATIVAN) tablet 1 mg  1 mg Oral Q6H PRN Morris Longenecker, Myer Peer, MD      . magnesium hydroxide (MILK OF MAGNESIA) suspension 30 mL  30 mL Oral Daily PRN Starkes-Perry, Gayland Curry, FNP      . multivitamin with minerals tablet 1 tablet  1 tablet Oral Daily Ore[place]wa Gilmer A, MD      . nicotine (NICODERM CQ - dosed in mg/24 hr) patch 7 mg  7 mg Transdermal Daily [name], [name], NP   7 mg at 03/29/20 0817  . [START ON 03/30/2020] thiamine tablet 100 mg  100 mg Oral Daily Turon Kilmer, Myer Peer, MD       PTA Medications: Medications Prior to Admission  Medication Sig Dispense Refill Last Dose  . meloxicam (MOBIC) 7.5 MG tablet Take 1 tablet (7.5 mg total) by mouth daily. 20 tablet 0   . metFORMIN (GLUCOPHAGE-XR) 750 MG 24 hr tablet Take 750 mg by mouth daily with breakfast.     . methocarbamol (ROBAXIN) 500 MG tablet Take 1 tablet (500 mg total) by mouth 2 (two) times daily. 20 tablet 0   . naproxen sodium (ALEVE) 220 MG tablet Take 2 tablets every 12 hours until stone passes.     Marland Kitchen spironolactone (ALDACTONE) 100 MG tablet Take 200 mg by mouth daily.     Marland Kitchen venlafaxine XR (EFFEXOR-XR) 150 MG 24 hr capsule TAKE 1 CAPSULE BY MOUTH EVERY DAY IN THE MORNING.       Musculoskeletal: Strength & Muscle Tone: within normal limits Gait & Station: normal Patient leans: N/A  Psychiatric Specialty Exam: Physical Exam  Nursing note and vitals reviewed. Constitutional: She is oriented to person, place, and time. She appears well-developed.  HENT:  Head: Normocephalic.  Eyes: Pupils are equal, round, and reactive to light.  Cardiovascular: Normal rate.  Respiratory: Effort normal.  Genitourinary:    Genitourinary Comments: Deferred   Musculoskeletal:        General: Normal range of motion.     Cervical back: Normal range of motion.  Neurological: She is alert and oriented to person, place, and time.  Skin: Skin is warm and dry.    Review of Systems  Constitutional: Negative for chills, diaphoresis and fever.  HENT: Negative for congestion, rhinorrhea, sneezing and sore throat.   Eyes: Negative for discharge.  Respiratory: Negative for cough, chest tightness, shortness of breath and wheezing.   Cardiovascular: Negative for chest pain and palpitations.  Gastrointestinal: Negative for diarrhea, nausea and vomiting.  Endocrine: Negative for cold intolerance.  Genitourinary: Negative for difficulty urinating.  Musculoskeletal: Negative.  Skin: Negative.   Allergic/Immunologic: Negative for environmental allergies and food allergies.       Allergies: NKDA  Neurological: Negative for dizziness, tremors, seizures, syncope, speech difficulty, weakness, light-headedness and headaches.  Psychiatric/Behavioral: Positive for dysphoric mood. Negative for agitation, behavioral problems, confusion, decreased concentration, hallucinations, self-injury, sleep disturbance and suicidal ideas. The patient is nervous/anxious. The patient is not hyperactive.     Blood pressure 121/84, pulse 95, temperature 98.5 F (36.9 C), temperature source Oral, resp. rate 16, height 5' 6"  (1.676 m), weight 75.3 kg, SpO2 100 %.Body mass index is 26.79 kg/m.  General Appearance:  Casual  Eye Contact:  Good  Speech:  Normal Rate  Volume:  Normal  Mood:  Depressed although reports feeling better today  Affect:  Congruent, anxious  Thought Process:  Linear and Descriptions of Associations: Intact  Orientation:  Full (Time, Place, and Person)  Thought Content:  No hallucinations, no delusions expressed  Suicidal Thoughts:  No denies suicidal or self-injurious ideations, contracts for safety on unit  Homicidal Thoughts:  No denies any violent or homicidal ideations  Memory:  Recent and remote grossly intact  Judgement:  Fair  Insight:  Fair  Psychomotor Activity:  Normal-slight/subtle distal tremors, but n psychomotor agitation or restlessness  Concentration:  Concentration: Good and Attention Span: Good  Recall:  Good  Fund of Knowledge:  Good  Language:  Good  Akathisia:  Negative  Handed:  Right  AIMS (if indicated):     Assets:  Communication Skills Desire for Improvement Resilience  ADL's:  Intact  Cognition:  WNL     Sleep:  Number of Hours: 2.75   Treatment Plan Summary: Daily contact with patient to assess and evaluate symptoms and progress in treatment and Medication management.  Treatment Plan/Recommendations:  1. Admit for crisis management and stabilization, estimated length of stay 3-5 days.    2. Medication management to reduce current symptoms to base line and improve the patient's overall level of functioning: See MAR, Md's SRA & treatment plan.   Observation Level/Precautions:  15 minute checks  Laboratory:  Per ED  Psychotherapy: Group sessions   Medications: See [facility]  Consultations: As needed.  Discharge Concerns: Safety, mood stability   Estimated LOS: 2-4 days  Other:Admit to the 300-hal    Physician Treatment Plan for Primary Diagnosis: MDD (major depressive disorder), single episode, severe ([place])  Long Term Goal(s): Improvement in symptoms so as ready for discharge  Short Term Goals: Ability to identify changes in lifestyle  to reduce recurrence of condition will improve, Ability to verbalize feelings will improve and Ability to disclose and discuss suicidal ideas  Physician Treatment Plan for Secondary Diagnosis: Principal Problem:   MDD (major depressive disorder), single episode, severe ([place])  Long Term Goal(s): Improvement in symptoms so as ready for discharge  Short Term Goals: Ability to identify and develop effective coping behaviors will improve, Compliance with prescribed medications will improve and Ability to identify triggers associated with substance abuse/mental health issues will improve  I certify that inpatient services furnished can reasonably be expected to improve the patient's condition.    Lindell Spar, NP, PMHNP, FNP-BC 5/27/20212:55 PM   I have discussed case with NP and have met with patient  Agree with NP note and assessment  29 year old female, single, no children, lives with parents. 29 year old female, presented on 5/26 reporting depression with thoughts of drowning herself or cutting her wrists.  Endorsed neurovegetative symptoms to include low energy, poor sleep, decreased appetite, anhedonia.  In  addition to depression also describes increased anxiety recently.  She also reported increased frequency of alcohol consumption.  Reports she drinks 2-3 times a week on average up to 4 drinks per sitting.  (Denies any history of seizures or DTs, does sometimes feel "shaky" on days following alcohol consumption).  She denies any drug abuse.  Admission BAL 211, UDS positive for cannabis. Attributes depression in part to strained family relationship.  She had recently informed her parents about her sexual orientation and they did not take it well.  She also lost her job recently. No prior psychiatric admissions.  No history of suicide attempts.  No history of psychosis.  Does not endorse history of PTSD.  No clear history of mania or hypomania.  Endorses  history of depression and anxiety.  She had  been treated with Effexor XR in the past but has not been on any medication for about a year. Denies medical illnesses, NKDA.  Currently presents with slight distal tremors and feels subjectively flushed.  No headache or visual disturbances at this time.  Vitals are stable BP 121/84, pulse 95.  Diagnosis-MDD without psychotic features versus alcohol induced mood disorder.  Plan-inpatient admission\. We discussed treatment options, agrees to antidepressant trial.  Prefers to try a new antidepressant rather than return to Effexor XR which she had taken in the past.  Has been started on Lexapro- side effects reviewed.  She was also started on Neurontin for anxiety , currently 800 mg 3 times daily. Ativan as needed for alcohol withdrawal as per CIWA protocol.

## 2020-03-29 NOTE — Progress Notes (Signed)
D:  Patient's self inventory sheet, patient has poor sleep, sleep medication not helpful.  Poor appetite, low energy level, poor concentration.  Rated depression and hopeless 8, anxiety 9.  Withdrawals, chilling, nausea.  Denied SI.  Physical problems, lightheaded.  Denied physical pain. Goal is find peace and positive outlook.  Plans to breath slowly.  "Does not feel that she is benefiting from being here.  Feels uncomfortable and restless. No discharge plans. A:  Medications administered per MD orders.  Emotional support and encouragement given patient. R:  Denied SI and HI, contracts for safety.  Denied A/V hallucinations  Safety maintained with 15 minute checks.

## 2020-03-29 NOTE — [facility] Suicide Risk Assessment (Addendum)
[facility] Admission Suicide Risk Assessment   Nursing information obtained from:  Patient Demographic factors:  Caucasian, Living alone Current Mental Status:  Suicidal ideation indicated by patient Loss Factors:  NA Historical Factors:  NA Risk Reduction Factors:  Positive social support  Total Time spent with patient: 45 minutes Principal Problem:  Diagnosis:  Active Problems:   MDD (major depressive disorder), single episode, severe (HCC)  Subjective Data:   Continued Clinical Symptoms:  Alcohol Use Disorder Identification Test Final Score (AUDIT): 10 The "Alcohol Use Disorders Identification Test", Guidelines for Use in Primary Care, Second Edition.  World Science writer [facility]). Score between 0-7:  no or low risk or alcohol related problems. Score between 8-15:  moderate risk of alcohol related problems. Score between 16-19:  high risk of alcohol related problems. Score 20 or above:  warrants further diagnostic evaluation for alcohol dependence and treatment.   CLINICAL FACTORS:  29 year old female, single, no children, lives with parents. 29 year old female, presented on 5/26 reporting depression with thoughts of drowning herself or cutting her wrists.  Endorsed neurovegetative symptoms to include low energy, poor sleep, decreased appetite, anhedonia.  In addition to depression also describes increased anxiety recently.  She also reported increased frequency of alcohol consumption.  Reports she drinks 2-3 times a week on average up to 4 drinks per sitting.  (Denies any history of seizures or DTs, does sometimes feel "shaky" on days following alcohol consumption).  She denies any drug abuse.  Admission BAL 211, UDS positive for cannabis. Attributes depression in part to strained family relationship.  She had recently informed her parents about her sexual orientation and they did not take it well.  She also lost her job recently. No prior psychiatric admissions.  No history of suicide  attempts.  No history of psychosis.  Does not endorse history of PTSD.  No clear history of mania or hypomania.  Endorses  history of depression and anxiety.  She had been treated with Effexor XR in the past but has not been on any medication for about a year. Denies medical illnesses, NKDA.  Currently presents with slight distal tremors and feels subjectively flushed.  No headache or visual disturbances at this time.  Vitals are stable BP 121/84, pulse 95.  Diagnosis-MDD without psychotic features versus alcohol induced mood disorder.  Plan-inpatient admission\. We discussed treatment options, agrees to antidepressant trial.  Prefers to try a new antidepressant rather than return to Effexor XR which she had taken in the past.  Has been started on Lexapro- side effects reviewed.  She was also started on Neurontin for anxiety , currently 100 mg 3 times daily. Ativan as needed for alcohol withdrawal as per CIWA protocol.   Musculoskeletal: Strength & Muscle Tone: within normal limits Gait & Station: normal Patient leans: N/A  Psychiatric Specialty Exam: Physical Exam  Review of Systems no headache, no visual disturbances, no chest pain, no shortness of breath, no nausea, no vomiting  Blood pressure 121/84, pulse 95, temperature 98.5 F (36.9 C), temperature source Oral, resp. rate 16, height 5\' 6"  (1.676 m), weight 75.3 kg, SpO2 100 %.Body mass index is 26.79 kg/m.  General Appearance: Casual  Eye Contact:  Good  Speech:  Normal Rate  Volume:  Normal  Mood:  Depressed although reports feeling better today  Affect:  Congruent, anxious  Thought Process:  Linear and Descriptions of Associations: Intact  Orientation:  Full (Time, Place, and Person)  Thought Content:  No hallucinations, no delusions expressed  Suicidal Thoughts:  No denies suicidal or self-injurious ideations, contracts for safety on unit  Homicidal Thoughts:  No denies any violent or homicidal ideations  Memory:  Recent  and remote grossly intact  Judgement:  Fair  Insight:  Fair  Psychomotor Activity:  Normal-slight/subtle distal tremors, but no psychomotor agitation or restlessness  Concentration:  Concentration: Good and Attention Span: Good  Recall:  Good  Fund of Knowledge:  Good  Language:  Good  Akathisia:  Negative  Handed:  Right  AIMS (if indicated):     Assets:  Communication Skills Desire for Improvement Resilience  ADL's:  Intact  Cognition:  WNL  Sleep:  Number of Hours: 2.75      COGNITIVE FEATURES THAT CONTRIBUTE TO RISK:  Closed-mindedness, Loss of executive function and Polarized thinking    SUICIDE RISK:   Moderate:  Frequent suicidal ideation with limited intensity, and duration, some specificity in terms of plans, no associated intent, good self-control, limited dysphoria/symptomatology, some risk factors present, and identifiable protective factors, including available and accessible social support.  PLAN OF CARE: Patient will be admitted to inpatient psychiatric unit for stabilization and safety. Will provide and encourage milieu participation. Provide medication management and maked adjustments as needed.  We will also provide medication management to address withdrawal as needed- will follow daily.    I certify that inpatient services furnished can reasonably be expected to improve the patient's condition.   Jenne Campus, MD 03/29/2020, 2:39 PM

## 2020-03-29 NOTE — Progress Notes (Signed)
Patient ID: Olivia Le, female   DOB: [birth year], 29 y.o.   MRN: [id number] D: Pt here voluntarily as walk-in. Pt has passive SI d/t stressors of losing job (d/t Covid), financial stresses, telling her family she is gay and the end of relationship with her GF. Family is not supportive of her lifestyle choice. Pt is self-medicating with alcohol. BAL on admission was 211. Pt highly anxious d/t situation and being admitted to [facility]. This is her first admission. A: Pt was offered support and encouragement. Admission process completed on OBS unit before transfer to 300 hall adult inpatient. Pt offered food and drink and both accepted but pt couldn't eat her meal d/t anxiety. Pt introduced to unit milieu by nursing staff. Q 15 minute checks were started for safety.  R: Pt safety maintained on unit.

## 2020-03-29 NOTE — Tx Team (Signed)
Initial Treatment Plan 03/29/2020 2:58 AM [name] FOA:[id number]    PATIENT STRESSORS: Financial difficulties Marital or family conflict   PATIENT STRENGTHS: Average or above average intelligence Capable of independent living Physical Health   PATIENT IDENTIFIED PROBLEMS: Suicidal ideation  Depression  Alcohol abuse  "Coming out" to family that she is gay - unsupportive               DISCHARGE CRITERIA:  Improved stabilization in mood, thinking, and/or behavior Motivation to continue treatment in a less acute level of care Verbal commitment to aftercare and medication compliance  PRELIMINARY DISCHARGE PLAN: Attend aftercare/continuing care group Attend PHP/IOP Outpatient therapy Return to previous living arrangement  PATIENT/FAMILY INVOLVEMENT: This treatment plan has been presented to and reviewed with the patient, Olivia Le, and/or family member.  The patient and family have been given the opportunity to ask questions and make suggestions.  Victorino December, RN 03/29/2020, 2:58 AM

## 2020-03-29 NOTE — Progress Notes (Signed)
UA container had been given to patient for sample.

## 2020-03-30 MED ORDER — IBUPROFEN 600 MG PO TABS
600.0000 mg | ORAL_TABLET | Freq: Three times a day (TID) | ORAL | Status: DC | PRN
  Administered 2020-03-30 – 2020-03-31 (×2): 600 mg via ORAL

## 2020-03-30 MED ORDER — IBUPROFEN 600 MG PO TABS
ORAL_TABLET | ORAL | Status: AC
  Filled 2020-03-30: qty 1

## 2020-03-30 NOTE — [facility] Group Notes (Signed)
03/30/2020 8:45am Type of Group and Topic: Psychoeducational Group: Discharge Planning   Participation Level: Active  Description of Group Discharge planning group reviews patient's anticipated discharge plans and assists patients to anticipate and address any barriers to wellness/recovery in the community. Suicide prevention education is reviewed with patients in group. Therapeutic Goals 1. Patients will state their anticipated discharge plan and mental health aftercare 2. Patients will identify potential barriers to wellness in the community setting 3. Patients will engage in problem solving, solution focused discussion of ways to anticipate and address barriers to wellness/recovery   Summary of Patient Progress Plan for Discharge/Comments: Reports she would like to receive medication management and therapy services at discharge. She states she would like to receive more intensive therapy services. She states she has no concerns with returning home and that she is ready to discharge.   Transportation Means: To be determined    Supports:  Patient reports her family are her supports.   Therapeutic Modalities: Motivational Interviewing     Baldo Daub, MSW, LCSWA Clinical Social Worker [facility]  Phone: [phone] 03/30/2020 2:01 PM

## 2020-03-30 NOTE — Progress Notes (Signed)
[facility] MD Progress Note  03/30/2020 5:36 PM Olivia Le  MRN:  [id number] Subjective: Patient reports she is feeling better today.  Describes feeling less anxious, "calmer". Denies suicidal ideations.  Denies medication side effects and states she feels current medications have been well-tolerated thus far. Objective: Have discussed case with treatment team and met with patient. 29 year old single female, lives with parents.  Presented to hospital with depression and suicidal thoughts, endorsed neurovegetative symptoms.  Also reported binge drinking (2-3 times a week, up to 4 drinks per sitting).  Admission BAL was 211.  Attributed depression in part to strained family relationship.  She had recently informed parents about her sexual orientation and states they did not take it well leading to increased family tension.   Today patient presents alert, attentive, no psychomotor agitation or restlessness are noted.  Does not endorse withdrawal symptoms and currently does not present with tremors,  diaphoresis, or  flushing.  BP 117/91, pulse 73 this AM.  She reports she she is feeling better than on admission. Denies SI and presents future oriented . Behavior on unit has been appropriate, visible in day room and has been attending group therapy sessions. No psychomotor agitation.  She is hopeful for discharge soon.  With regards to her stressors, states that she realizes her parents love her and want the best for her but expresses frustration regarding how they reacted to her confiding in them regarding her sexual orientation. Today states " but I cannot control how they feel, so I just have to focus on me".  Principal Problem: MDD (major depressive disorder), single episode, severe ([facility]) Diagnosis: Principal Problem:   MDD (major depressive disorder), single episode, severe ([place])  Total Time spent with patient: 20 minutes  Past Psychiatric History:   Past Medical History:  Past Medical History:   Diagnosis Date  . Kidney stone   . Polycystic disease, ovaries     Past Surgical History:  Procedure Laterality Date  . TONSILLECTOMY     Family History:  Family History  Problem Relation Age of Onset  . Hypertension Mother   . Healthy Father    Family Psychiatric  History:  Social History:  Social History   Substance and Sexual Activity  Alcohol Use Yes   Comment: weekly     Social History   Substance and Sexual Activity  Drug Use No    Social History   Socioeconomic History  . Marital status: Single    Spouse name: Not on file  . Number of children: Not on file  . Years of education: Not on file  . Highest education level: Not on file  Occupational History  . Not on file  Tobacco Use  . Smoking status: Never Smoker  . Smokeless tobacco: Never Used  Substance and Sexual Activity  . Alcohol use: Yes    Comment: weekly  . Drug use: No  . Sexual activity: Not on file  Other Topics Concern  . Not on file  Social History Narrative  . Not on file   Social Determinants of Health   Financial Resource Strain:   . Difficulty of Paying Living Expenses:   Food Insecurity:   . Worried About Charity fundraiser in the Last Year:   . Arboriculturist in the Last Year:   Transportation Needs:   . Film/video editor (Medical):   Marland Kitchen Lack of Transportation (Non-Medical):   Physical Activity:   . Days of Exercise per Week:   .  Minutes of Exercise per Session:   Stress:   . Feeling of Stress :   Social Connections:   . Frequency of Communication with Friends and Family:   . Frequency of Social Gatherings with Friends and Family:   . Attends Religious Services:   . Active Member of Clubs or Organizations:   . Attends Archivist Meetings:   Marland Kitchen Marital Status:    Additional Social History:    Pain Medications: None Prescriptions: None Over the Counter: None History of alcohol / drug use?: Yes Longest period of sobriety (when/how long): N/A Name of  Substance 1: ETOH 1 - Age of First Use: early 105s 1 - Amount (size/oz): 3-4 drinks 1 - Frequency: 3-4 times per week 1 - Duration: increased use past 3-4 months 1 - Last Use / Amount: today, 3-4 drinks   Sleep: Fair  Appetite:  Good  Current Medications: Current Facility-Administered Medications  Medication Dose Route Frequency Provider Last Rate Last Admin  . ibuprofen (ADVIL) 600 MG tablet           . escitalopram (LEXAPRO) tablet 5 mg  5 mg Oral QHS Suella Broad, FNP   5 mg at 03/29/20 2124  . gabapentin (NEURONTIN) capsule 100 mg  100 mg Oral TID Lindell Spar I, NP   100 mg at 03/30/20 1600  . hydrOXYzine (ATARAX/VISTARIL) tablet 25 mg  25 mg Oral Q6H PRN Trinidee Schrag, Myer Peer, MD   25 mg at 03/30/20 1103  . ibuprofen (ADVIL) tablet 600 mg  600 mg Oral Q8H PRN Money, Lowry Ram, FNP   600 mg at 03/30/20 1557  . LORazepam (ATIVAN) tablet 1 mg  1 mg Oral Q6H PRN Tyiesha Brackney, Myer Peer, MD   1 mg at 03/29/20 2124  . magnesium hydroxide (MILK OF MAGNESIA) suspension 30 mL  30 mL Oral Daily PRN Suella Broad, FNP      . multivitamin with minerals tablet 1 tablet  1 tablet Oral Daily Macky Galik, Myer Peer, MD   1 tablet at 03/30/20 0806  . nicotine (NICODERM CQ - dosed in mg/24 hr) patch 7 mg  7 mg Transdermal Daily [name], [name], NP   7 mg at 03/30/20 0807  . thiamine tablet 100 mg  100 mg Oral Daily Kaushik Maul, Myer Peer, MD   100 mg at 03/30/20 6767    Lab Results:  Results for orders placed or performed during the hospital encounter of 03/28/20 (from the past 48 hour(s))  SARS Coronavirus 2 by RT PCR (hospital order, performed in [facility] hospital lab) Nasopharyngeal Nasopharyngeal Swab     Status: None   Collection Time: 03/28/20  5:45 PM   Specimen: Nasopharyngeal Swab  Result Value Ref Range   SARS Coronavirus 2 NEGATIVE NEGATIVE    Comment: (NOTE) SARS-CoV-2 target nucleic acids are NOT DETECTED. The SARS-CoV-2 RNA is generally detectable in upper and lower respiratory  specimens during the acute phase of infection. The lowest concentration of SARS-CoV-2 viral copies this assay can detect is 250 copies / mL. A negative result does not preclude SARS-CoV-2 infection and should not be used as the sole basis for treatment or other patient management decisions.  A negative result may occur with improper specimen collection / handling, submission of specimen other than nasopharyngeal swab, presence of viral mutation(s) within the areas targeted by this assay, and inadequate number of viral copies (<250 copies / mL). A negative result must be combined with clinical observations, patient history, and epidemiological information. Fact Sheet  for Patients:   StrictlyIdeas.no Fact Sheet for Healthcare Providers: BankingDealers.co.za This test is not yet approved or cleared  by the [place] FDA and has been authorized for detection and/or diagnosis of SARS-CoV-2 by FDA under an Emergency Use Authorization (EUA).  This EUA will remain in effect (meaning this test can be used) for the duration of the COVID-19 declaration under Section 564(b)(1) of the Act, 21 U.S.C. section 360bbb-3(b)(1), unless the authorization is terminated or revoked sooner. Performed at [facility], [place] [address] [place], [place] [address]   Pregnancy, urine     Status: None   Collection Time: 03/28/20  5:45 PM  Result Value Ref Range   Preg Test, Ur NEGATIVE NEGATIVE    Comment:        THE SENSITIVITY OF THIS METHODOLOGY IS >20 mIU/mL. Performed at [facility], [place] [address], [place], [place] [address]   Rapid urine drug screen (hospital performed)     Status: Abnormal   Collection Time: 03/28/20  5:45 PM  Result Value Ref Range   Opiates NONE DETECTED NONE DETECTED   Cocaine NONE DETECTED NONE DETECTED   Benzodiazepines NONE DETECTED NONE DETECTED   Amphetamines NONE DETECTED NONE DETECTED    Tetrahydrocannabinol POSITIVE (A) NONE DETECTED   Barbiturates NONE DETECTED NONE DETECTED    Comment: (NOTE) DRUG SCREEN FOR MEDICAL PURPOSES ONLY.  IF CONFIRMATION IS NEEDED FOR ANY PURPOSE, NOTIFY LAB WITHIN 5 DAYS. LOWEST DETECTABLE LIMITS FOR URINE DRUG SCREEN Drug Class                     Cutoff (ng/mL) Amphetamine and metabolites    1000 Barbiturate and metabolites    200 Benzodiazepine                 834 Tricyclics and metabolites     300 Opiates and metabolites        300 Cocaine and metabolites        300 THC                            50 Performed at [facility], [place] [address], [place], [place] [address]   Urinalysis, Routine w reflex microscopic     Status: None   Collection Time: 03/28/20  5:45 PM  Result Value Ref Range   Color, Urine YELLOW YELLOW   APPearance CLEAR CLEAR   Specific Gravity, Urine 1.018 1.005 - 1.030   pH 6.0 5.0 - 8.0   Glucose, UA NEGATIVE NEGATIVE mg/dL   Hgb urine dipstick NEGATIVE NEGATIVE   Bilirubin Urine NEGATIVE NEGATIVE   Ketones, ur NEGATIVE NEGATIVE mg/dL   Protein, ur NEGATIVE NEGATIVE mg/dL   Nitrite NEGATIVE NEGATIVE   Leukocytes,Ua NEGATIVE NEGATIVE    Comment: Performed at [place] [address], [place], [place] [address]  CBC with Differential/Platelet     Status: Abnormal   Collection Time: 03/28/20  6:35 PM  Result Value Ref Range   WBC 10.8 (H) 4.0 - 10.5 K/uL   RBC 4.36 3.87 - 5.11 MIL/uL   Hemoglobin 12.6 12.0 - 15.0 g/dL   HCT 39.5 36.0 - 46.0 %   MCV 90.6 80.0 - 100.0 fL   MCH 28.9 26.0 - 34.0 pg   MCHC 31.9 30.0 - 36.0 g/dL   RDW 14.4 11.5 - 15.5 %   Platelets 356 150 - 400 K/uL   nRBC 0.0 0.0 -  0.2 %   Neutrophils Relative % 59 %   Neutro Abs 6.4 1.7 - 7.7 K/uL   Lymphocytes Relative 32 %   Lymphs Abs 3.5 0.7 - 4.0 K/uL   Monocytes Relative 7 %   Monocytes Absolute 0.7 0.1 - 1.0 K/uL   Eosinophils Relative 0 %   Eosinophils Absolute 0.0 0.0 - 0.5 K/uL    Basophils Relative 1 %   Basophils Absolute 0.1 0.0 - 0.1 K/uL   Immature Granulocytes 1 %   Abs Immature Granulocytes 0.12 (H) 0.00 - 0.07 K/uL    Comment: Performed at [facility], [place] [address], [place], [place] [address]  Comprehensive metabolic panel     Status: Abnormal   Collection Time: 03/28/20  6:35 PM  Result Value Ref Range   Sodium 143 135 - 145 mmol/L   Potassium 4.3 3.5 - 5.1 mmol/L   Chloride 107 98 - 111 mmol/L   CO2 27 22 - 32 mmol/L   Glucose, Bld 102 (H) 70 - 99 mg/dL    Comment: Glucose reference range applies only to samples taken after fasting for at least 8 hours.   BUN 12 6 - 20 mg/dL   Creatinine, Ser 0.73 0.44 - 1.00 mg/dL   Calcium 8.2 (L) 8.9 - 10.3 mg/dL   Total Protein 7.3 6.5 - 8.1 g/dL   Albumin 4.2 3.5 - 5.0 g/dL   AST 33 15 - 41 U/L   ALT 20 0 - 44 U/L   Alkaline Phosphatase 70 38 - 126 U/L   Total Bilirubin 0.3 0.3 - 1.2 mg/dL   GFR calc non Af Amer >60 >60 mL/min   GFR calc Af Amer >60 >60 mL/min   Anion gap 9 5 - 15    Comment: Performed at [facility], [place] [address], [place], [place] [address]  Acetaminophen level     Status: Abnormal   Collection Time: 03/28/20  6:35 PM  Result Value Ref Range   Acetaminophen (Tylenol), Serum <10 (L) 10 - 30 ug/mL    Comment: (NOTE) Therapeutic concentrations vary significantly. A range of 10-30 ug/mL  may be an effective concentration for many patients. However, some  are best treated at concentrations outside of this range. Acetaminophen concentrations >150 ug/mL at 4 hours after ingestion  and >50 ug/mL at 12 hours after ingestion are often associated with  toxic reactions. Performed at [facility], [place] [address], [place], [place] [address]   Salicylate level     Status: Abnormal   Collection Time: 03/28/20  6:35 PM  Result Value Ref Range   Salicylate Lvl <0.0 (L) 7.0 - 30.0 mg/dL    Comment: Performed at [facility], [place] [address], [place], [place] [address]  Ethanol     Status: Abnormal   Collection Time: 03/28/20  6:35 PM  Result Value Ref Range   Alcohol, Ethyl (B) 211 (H) <10 mg/dL    Comment: (NOTE) Lowest detectable limit for serum alcohol is 10 mg/dL. For medical purposes only. Performed at [facility], [place] [address] [place], [place] [address]   TSH     Status: None   Collection Time: 03/29/20  6:38 AM  Result Value Ref Range   TSH 2.297 0.350 - 4.500 uIU/mL    Comment: Performed by a 3rd Generation assay with a functional sensitivity of <=0.01 uIU/mL. Performed at [facility], [place] [address] [place], [place] [address]   Lipid panel  Status: None   Collection Time: 03/29/20  6:38 AM  Result Value Ref Range   Cholesterol 149 0 - 200 mg/dL   Triglycerides 29 <150 mg/dL   HDL 97 >40 mg/dL   Total CHOL/HDL Ratio 1.5 RATIO   VLDL 6 0 - 40 mg/dL   LDL Cholesterol 46 0 - 99 mg/dL    Comment:        Total Cholesterol/HDL:CHD Risk Coronary Heart Disease Risk Table                     Men   Women  1/2 Average Risk   3.4   3.3  Average Risk       5.0   4.4  2 X Average Risk   9.6   7.1  3 X Average Risk  23.4   11.0        Use the calculated Patient Ratio above and the CHD Risk Table to determine the patient's CHD Risk.        ATP III CLASSIFICATION (LDL):  <100     mg/dL   Optimal  100-129  mg/dL   Near or Above                    Optimal  130-159  mg/dL   Borderline  160-189  mg/dL   High  >190     mg/dL   Very High Performed at [place] [address], [place], [place] [address]   Hemoglobin A1c     Status: None   Collection Time: 03/29/20  6:38 AM  Result Value Ref Range   Hgb A1c MFr Bld 4.9 4.8 - 5.6 %    Comment: (NOTE) Pre diabetes:          5.7%-6.4% Diabetes:              >6.4% Glycemic control for   <7.0% adults with diabetes    Mean Plasma Glucose 93.93 mg/dL    Comment: Performed  at [place] [address], [place], [place] [address]    Blood Alcohol level:  Lab Results  Component Value Date   ETH 211 (H) 14/97/0263    Metabolic Disorder Labs: Lab Results  Component Value Date   HGBA1C 4.9 03/29/2020   MPG 93.93 03/29/2020   No results found for: PROLACTIN Lab Results  Component Value Date   CHOL 149 03/29/2020   TRIG 29 03/29/2020   HDL 97 03/29/2020   CHOLHDL 1.5 03/29/2020   VLDL 6 03/29/2020   LDLCALC 46 03/29/2020    Physical Findings: AIMS: Facial and Oral Movements Muscles of Facial Expression: None, normal Lips and Perioral Area: None, normal Jaw: None, normal Tongue: None, normal,Extremity Movements Upper (arms, wrists, hands, fingers): None, normal Lower (legs, knees, ankles, toes): None, normal, Trunk Movements Neck, shoulders, hips: None, normal, Overall Severity Severity of abnormal movements (highest score from questions above): None, normal Incapacitation due to abnormal movements: None, normal Patient's awareness of abnormal movements (rate only patient's report): No Awareness, Dental Status Current problems with teeth and/or dentures?: No Does patient usually wear dentures?: No  CIWA:  CIWA-Ar Total: 5 COWS:  COWS Total Score: 2  Musculoskeletal: Strength & Muscle Tone: within normal limits no tremors, no diaphoresis, no restlessness or agitation Gait & Station: normal Patient leans: N/A  Psychiatric Specialty Exam: Physical Exam  Review of Systems denies headache, no visual disturbances, no chest pain, no shortness of breath, no vomiting  Blood pressure (!) 137/108, pulse 98, temperature 97.6 F (36.4 C), temperature source Oral, resp. rate 16, height 5' 6"  (1.676 m), weight 75.3 kg, SpO2 99 %.Body mass index is 26.79 kg/m.  Repeat BP 124/21, pulse 79  General Appearance: Casual  Eye Contact:  Good  Speech:  Normal Rate  Volume:  Normal  Mood:  Improving mood  Affect:  More reactive, appropriate, smiles  at times appropriately during session  Thought Process:  Linear and Descriptions of Associations: Intact  Orientation:  Full (Time, Place, and Person)  Thought Content:  No hallucinations, no delusions, not internally preoccupied  Suicidal Thoughts:  No denies suicidal or self-injurious ideations, denies homicidal or violent ideations  Homicidal Thoughts:  No  Memory:  Recent and remote grossly intact  Judgement:  Other:  Fair/improving  Insight:  Fair/improving  Psychomotor Activity:  Normal  Concentration:  Concentration: Good and Attention Span: Good  Recall:  Good  Fund of Knowledge:  Good  Language:  Good  Akathisia:  Negative  Handed:  Right  AIMS (if indicated):     Assets:  Communication Skills Desire for Improvement Resilience  ADL's:  Intact  Cognition:  WNL  Sleep:  Number of Hours: 3.5    Assessment:   29 year old single female, lives with parents.  Presented to hospital with depression and suicidal thoughts, endorsed neurovegetative symptoms.  Also reported binge drinking (2-3 times a week, up to 4 drinks per sitting).  Admission BAL was 211.  Attributed depression in part to strained family relationship.  She had recently informed parents about her sexual orientation and states they did not take it well leading to increased family tension.  Currently reports feeling better than on admission.  Her mood and affect present improved.  Today denies any suicidal ideations and is contracting for safety.  She is now presenting with significant symptoms of alcohol withdrawal at this time.  Thus far tolerating medications well (Lexapro, Neurontin)  Treatment Plan Summary: Daily contact with patient to assess and evaluate symptoms and progress in treatment, Medication management, Plan Inpatient treatment and Medications as below Encourage group and milieu participation Encourage efforts to work on sobriety and relapse prevention Continue Neurontin100 mg 3 times daily for  anxiety, alcohol use disorder Continue Lexapro 5 mg daily for anxiety, depression Continue Ativan as needed for alcohol withdrawal as per CIWA protocol if needed Treatment team working on disposition planning options Jenne Campus, MD 03/30/2020, 5:36 PM

## 2020-03-30 NOTE — Tx Team (Signed)
Interdisciplinary Treatment and Diagnostic Plan Update  03/30/2020 Time of Session: 9:00am Olivia Le MRN: [id number]  Principal Diagnosis: MDD (major depressive disorder), single episode, severe (HCC)  Secondary Diagnoses: Principal Problem:   MDD (major depressive disorder), single episode, severe (HCC)   Current Medications:  Current Facility-Administered Medications  Medication Dose Route Frequency Provider Last Rate Last Admin  . acetaminophen (TYLENOL) tablet 650 mg  650 mg Oral Q6H PRN [name], FNP   650 mg at 03/29/20 1093  . escitalopram (LEXAPRO) tablet 5 mg  5 mg Oral QHS [name], FNP   5 mg at 03/29/20 2124  . gabapentin (NEURONTIN) capsule 100 mg  100 mg Oral TID [name], NP   100 mg at 03/30/20 0806  . hydrOXYzine (ATARAX/VISTARIL) tablet 25 mg  25 mg Oral Q6H PRN Cobos, [name], MD   25 mg at 03/29/20 1906  . LORazepam (ATIVAN) tablet 1 mg  1 mg Oral Q6H PRN Cobos, [name], MD   1 mg at 03/29/20 2124  . magnesium hydroxide (MILK OF MAGNESIA) suspension 30 mL  30 mL Oral Daily PRN [name], FNP      . multivitamin with minerals tablet 1 tablet  1 tablet Oral Daily Cobos, [name], MD   1 tablet at 03/30/20 0806  . nicotine (NICODERM CQ - dosed in mg/24 hr) patch 7 mg  7 mg Transdermal Daily [name], [name], NP   7 mg at 03/30/20 0807  . thiamine tablet 100 mg  100 mg Oral Daily Cobos, [name], MD   100 mg at 03/30/20 2355   PTA Medications: Medications Prior to Admission  Medication Sig Dispense Refill Last Dose  . acetaminophen (TYLENOL) 325 MG tablet Take 650 mg by mouth every 6 (six) hours as needed for mild pain or headache.     . cholecalciferol (VITAMIN D3) 25 MCG (1000 UNIT) tablet Take 1,000 Units by mouth daily.     . ferrous sulfate 325 (65 FE) MG tablet Take 325 mg by mouth 2 (two) times daily with a meal.     . ibuprofen (ADVIL) 400 MG tablet Take 400 mg by mouth every 6 (six) hours as needed  for headache or mild pain.     . meloxicam (MOBIC) 7.5 MG tablet Take 1 tablet (7.5 mg total) by mouth daily. (Patient not taking: Reported on 03/29/2020) 20 tablet 0 Not Taking at Unknown time  . metFORMIN (GLUCOPHAGE-XR) 750 MG 24 hr tablet Take 750 mg by mouth daily with breakfast.   Not Taking at Unknown time  . methocarbamol (ROBAXIN) 500 MG tablet Take 1 tablet (500 mg total) by mouth 2 (two) times daily. (Patient not taking: Reported on 03/29/2020) 20 tablet 0 Not Taking at Unknown time  . naproxen sodium (ALEVE) 220 MG tablet Take 2 tablets every 12 hours until stone passes. (Patient not taking: Reported on 03/29/2020)   Not Taking at Unknown time  . spironolactone (ALDACTONE) 100 MG tablet Take 200 mg by mouth daily.   Not Taking at Unknown time  . venlafaxine XR (EFFEXOR-XR) 150 MG 24 hr capsule TAKE 1 CAPSULE BY MOUTH EVERY DAY IN THE MORNING.   Not Taking at Unknown time    Patient Stressors: Financial difficulties Marital or family conflict  Patient Strengths: Average or above average intelligence Capable of independent living Physical Health  Treatment Modalities: Medication Management, Group therapy, Case management,  1 to 1 session with clinician, Psychoeducation, Recreational therapy.   Physician Treatment  Plan for Primary Diagnosis: MDD (major depressive disorder), single episode, severe ([place]) Long Term Goal(s): Improvement in symptoms so as ready for discharge Improvement in symptoms so as ready for discharge   Short Term Goals: Ability to identify changes in lifestyle to reduce recurrence of condition will improve Ability to verbalize feelings will improve Ability to disclose and discuss suicidal ideas Ability to identify and develop effective coping behaviors will improve Compliance with prescribed medications will improve Ability to identify triggers associated with substance abuse/mental health issues will improve  Medication Management: Evaluate patient's response,  side effects, and tolerance of medication regimen.  Therapeutic Interventions: 1 to 1 sessions, Unit Group sessions and Medication administration.  Evaluation of Outcomes: Progressing  Physician Treatment Plan for Secondary Diagnosis: Principal Problem:   MDD (major depressive disorder), single episode, severe ([place])  Long Term Goal(s): Improvement in symptoms so as ready for discharge Improvement in symptoms so as ready for discharge   Short Term Goals: Ability to identify changes in lifestyle to reduce recurrence of condition will improve Ability to verbalize feelings will improve Ability to disclose and discuss suicidal ideas Ability to identify and develop effective coping behaviors will improve Compliance with prescribed medications will improve Ability to identify triggers associated with substance abuse/mental health issues will improve     Medication Management: Evaluate patient's response, side effects, and tolerance of medication regimen.  Therapeutic Interventions: 1 to 1 sessions, Unit Group sessions and Medication administration.  Evaluation of Outcomes: Progressing   RN Treatment Plan for Primary Diagnosis: MDD (major depressive disorder), single episode, severe ([place]) Long Term Goal(s): Knowledge of disease and therapeutic regimen to maintain health will improve  Short Term Goals: Ability to verbalize feelings will improve and Ability to identify and develop effective coping behaviors will improve  Medication Management: RN will administer medications as ordered by provider, will assess and evaluate patient's response and provide education to patient for prescribed medication. RN will report any adverse and/or side effects to prescribing provider.  Therapeutic Interventions: 1 on 1 counseling sessions, Psychoeducation, Medication administration, Evaluate responses to treatment, Monitor vital signs and CBGs as ordered, Perform/monitor CIWA, COWS, AIMS and Fall Risk  screenings as ordered, Perform wound care treatments as ordered.  Evaluation of Outcomes: Progressing   LCSW Treatment Plan for Primary Diagnosis: MDD (major depressive disorder), single episode, severe ([name]) Long Term Goal(s): Safe transition to appropriate next level of care at discharge, Engage patient in therapeutic group addressing interpersonal concerns.  Short Term Goals: Engage patient in aftercare planning with referrals and resources, Increase social support, Identify triggers associated with mental health/substance abuse issues and Increase skills for wellness and recovery  Therapeutic Interventions: Assess for all discharge needs, 1 to 1 time with Social worker, Explore available resources and support systems, Assess for adequacy in community support network, Educate family and significant other(s) on suicide prevention, Complete Psychosocial Assessment, Interpersonal group therapy.  Evaluation of Outcomes: Progressing  Progress in Treatment: Attending groups: Yes. Participating in groups: Yes. Taking medication as prescribed: Yes. Toleration medication: Yes. Family/Significant other contact made: No, will contact:  mother Patient understands diagnosis: Yes. Discussing patient identified problems/goals with staff: Yes. Medical problems stabilized or resolved: Yes. Denies suicidal/homicidal ideation: Yes. Issues/concerns per patient self-inventory: Yes.  New problem(s) identified: Yes, Describe:  financial stressors.  New Short Term/Long Term Goal(s): medication management for mood stabilization; elimination of SI thoughts; development of comprehensive mental wellness/sobriety plan.  Patient Goals: "Get medication management and therapy set up."  Discharge Plan or Barriers: Returning  home, plans to stay with parents temporarily. Following up with [facility]   Reason for Continuation of Hospitalization: Anxiety Depression Medication stabilization  Estimated Length of  Stay: 1-2 days  Attendees: Patient: Olivia Le 03/30/2020 9:18 AM  Physician:  03/30/2020 9:18 AM  Nursing:  03/30/2020 9:18 AM  RN Care Manager: 03/30/2020 9:18 AM  Social Worker: Enid Cutter, [place]  03/30/2020 9:18 AM  Recreational Therapist:  03/30/2020 9:18 AM  Other: Reola Calkins, NP 03/30/2020 9:18 AM  Other:  03/30/2020 9:18 AM  Other: 03/30/2020 9:18 AM    Scribe for Treatment Team: Darreld Mclean, LCSWA 03/30/2020 9:18 AM

## 2020-03-30 NOTE — Progress Notes (Signed)
Pt presents with anxiety and depression.  Pt states that she feels she is "okay to leave today" and she wants to go home.    RN actively listened to pt describe feelings and concerns.  RN administered pt's medications to pt per provider protocol  Safety checks q 15 min remain in place per protocol.   Pt is fidgety at times and anxious. RN observed Pt interacting with peers and attending groups.  RN will continue to monitor and provide support as needed.

## 2020-03-30 NOTE — Progress Notes (Signed)
Recreation Therapy Notes  Date:  5.28.21 Time: 0930 Location: 300 Hall Group Room  Group Topic: Stress Management  Goal Area(s) Addresses:  Patient will identify positive stress management techniques. Patient will identify benefits of using stress management post d/c.  Behavioral Response:  Engaged  Intervention: Stress Management  Activity: Guided Imagery.  LRT read a script that lead patients on a journey being outside at night observing the stars in the sky.  Patients were to listen and follow along as script was read to engage in activity.   Education:  Stress Management, Discharge Planning.   Education Outcome: Acknowledges Education  Clinical Observations/Feedback: Pt attended and participated in activity.     Caroll Rancher, LRT/CTRS    Lillia Abed, Marjette A 03/30/2020 11:59 AM

## 2020-03-30 NOTE — [facility] Suicide Risk Assessment (Signed)
[facility] INPATIENT:  Family/Significant Other Suicide Prevention Education  Suicide Prevention Education:  Education Completed; with mother, Olivia Le [phone]) has been identified by the patient as the family member/significant other with whom the patient will be residing, and identified as the person(s) who will aid the patient in the event of a mental health crisis (suicidal ideations/suicide attempt).  With written consent from the patient, the family member/significant other has been provided the following suicide prevention education, prior to the and/or following the discharge of the patient.  The suicide prevention education provided includes the following:  Suicide risk factors  Suicide prevention and interventions  National Suicide Hotline telephone number  [facility] assessment telephone number  [facility] Emergency Assistance 911  [facility] and/or Residential Mobile Crisis Unit telephone number  Request made of family/significant other to:  Remove weapons (e.g., guns, rifles, knives), all items previously/currently identified as safety concern.    Remove drugs/medications (over-the-counter, prescriptions, illicit drugs), all items previously/currently identified as a safety concern.  The family member/significant other verbalizes understanding of the suicide prevention education information provided.  The family member/significant other agrees to remove the items of safety concern listed above.  Patient's mother reports she does not have any concerns at this time. She reports the patient has expressed to her that she is ready to discharge and that she is agreeable. She reports the patient has experienced a number of psycho-social stressors including, unemployment, loneliness, hair-loss and questioning her sexuality.    Patient mother reports she is very supportive of the patient and will ensure the patient maintains outpatient follow up. There were  no further questions or concerns. CSW explained that once the attending provider determined a discharge date, either the patient or a provider will contact her again to discuss discharge planning. Patient's mother expressed understanding.   CSW will continue to follow.    Olivia Le 03/30/2020, 11:03 AM

## 2020-03-31 MED ORDER — ESCITALOPRAM OXALATE 10 MG PO TABS: 10.0000 mg | ORAL_TABLET | Freq: Every day | ORAL | 0 refills | Status: DC

## 2020-03-31 MED ORDER — HYDROXYZINE HCL 25 MG PO TABS: 25.0000 mg | ORAL_TABLET | Freq: Four times a day (QID) | ORAL | 0 refills | Status: DC | PRN

## 2020-03-31 MED ORDER — IBUPROFEN 600 MG PO TABS
ORAL_TABLET | ORAL | Status: AC
  Filled 2020-03-31: qty 1

## 2020-03-31 MED ORDER — GABAPENTIN 100 MG PO CAPS: 100.0000 mg | ORAL_CAPSULE | Freq: Three times a day (TID) | ORAL | 0 refills | Status: DC

## 2020-03-31 MED ORDER — NICOTINE 7 MG/24HR TD PT24: 7.0000 mg | MEDICATED_PATCH | Freq: Every day | TRANSDERMAL | 0 refills | Status: DC

## 2020-03-31 MED ORDER — ESCITALOPRAM OXALATE 10 MG PO TABS
10.0000 mg | ORAL_TABLET | Freq: Every day | ORAL | Status: DC
  Filled 2020-03-31 (×2): qty 1

## 2020-03-31 NOTE — [facility] Suicide Risk Assessment (Signed)
[facility] Discharge Suicide Risk Assessment   Principal Problem: MDD (major depressive disorder), single episode, severe (HCC) Discharge Diagnoses: Principal Problem:   MDD (major depressive disorder), single episode, severe (HCC)   Total Time spent with patient: 30 minutes  Musculoskeletal: Strength & Muscle Tone: within normal limits Gait & Station: normal Patient leans: N/A  Psychiatric Specialty Exam: Review of Systems no headache, no chest pain, no shortness of breath, no vomiting   Blood pressure (!) 122/94, pulse 68, temperature 98.5 F (36.9 C), temperature source Oral, resp. rate 16, height 5\' 6"  (1.676 m), weight 75.3 kg, SpO2 99 %.Body mass index is 26.79 kg/m.  General Appearance: Well Groomed  Eye Contact::  Good  Speech:  Normal Rate409  Volume:  Normal  Mood:  improved, states " I am feeling better"  Affect:  appropriate, reacitve  Thought Process:  Linear and Descriptions of Associations: Intact  Orientation:  Full (Time, Place, and Person)  Thought Content:  no hallucinations, no delusions, not internally preoccupied   Suicidal Thoughts:  No denies suicidal or self injurious ideations, denies homicidal or violent ideations  Homicidal Thoughts:  No  Memory:  recent and remote grossly intact   Judgement:  Other:  improving   Insight:  improving   Psychomotor Activity:  Normal  Concentration:  Good  Recall:  Good  Fund of Knowledge:Good  Language: Good  Akathisia:  Negative  Handed:  Right  AIMS (if indicated):     Assets:  Communication Skills Desire for Improvement Resilience  Sleep:  Number of Hours: 5.75  Cognition: WNL  ADL's:  Intact   Mental Status Per Nursing Assessment::   On Admission:  Suicidal ideation indicated by patient  Demographic Factors:  76 y old female, single , no children, lives with parents  Loss Factors: Reports strained relationship with her parents related to her sexual orientation. Unemployment  Historical Factors: No  prior psychiatric admissions, no past suicidal attempts , History of depression  Risk Reduction Factors:   Sense of responsibility to family, Living with another person, especially a relative and Positive coping skills or problem solving skills  Continued Clinical Symptoms:  Alert, attentive, well related, calm , pleasant on approach. Reports she is feeling better than on admission and presents with improving mood , fuller range of affect, no thought disorder, no suicidal or self injurious ideations, no homicidal or violent ideations, no psychotic symptoms. Presents future oriented. She is not presenting with any symptoms of alcohol WDL- no tremors, no diaphoresis, no restlessness or psychomotor agitation  Behavior on unit calm and in good control We reviewed medications/ side effect profiles - currently on Neurontin and on Lexapro which she has tolerated well thus far -will increase to 10 mgrs QDAY.    Cognitive Features That Contribute To Risk:  No gross cognitive deficits noted upon discharge. Is alert , attentive, and oriented x 3   Suicide Risk:  Mild:  Suicidal ideation of limited frequency, intensity, duration, and specificity.  There are no identifiable plans, no associated intent, mild dysphoria and related symptoms, good self-control (both objective and subjective assessment), few other risk factors, and identifiable protective factors, including available and accessible social support.  Follow-up Information    [facility] Follow up on 04/10/2020.   Why: Your hospital discharge appointment is scheduled for Tuesday, 04/10/20 at 10am. The provider will call you for this appointment. Please be sure to have your discharge paperwork available.  Contact information: [address]  [address] [place] [phone]  Plan Of Care/Follow-up recommendations:  Activity:  as tolerated  Diet:  regular Tests:  NA Other:  See below  Patient is expressing readiness  for discharge and is leaving unit in good spirits  Plans to return home, states S.O. will pick her up later today. Follow up as above We reviewed negative impact that alcohol abuse  may have on mental /physical health- she reports she plans to abstain from Peter, MD 03/31/2020, 8:04 AM

## 2020-03-31 NOTE — Progress Notes (Signed)
Pt discharged to lobby. Pt was stable and appreciative at that time. All papers  were given and valuables returned. Verbal understanding expressed. Denies SI/HI and A/VH. Pt given opportunity to express concerns and ask questions. °

## 2020-03-31 NOTE — Progress Notes (Signed)
  [facility] Adult Case Management Discharge Plan :  Will you be returning to the same living situation after discharge:  Yes,  back home with roommate At discharge, do you have transportation home?: Yes,  mother will transport Do you have the ability to pay for your medications: No.  Will need assistance from local agency, no income and no insurance currently  Release of information consent forms completed and emailed to Medical Records, then turned in to Medical Records by CSW.   Patient to Follow up at: Follow-up Information    [facility] Follow up on 04/10/2020.   Why: Your hospital discharge appointment is scheduled for Tuesday, 04/10/20 at 10:00am VIA TELEHEALTH. The provider will call you for this appointment. Please be sure to have your discharge paperwork available.  Contact information: [address]  [address] [place] [address] [phone]           Next level of care provider has access to [facility] Link:no  Safety Planning and Suicide Prevention discussed: Yes,  with mother  Have you used any form of tobacco in the last 30 days? (Cigarettes, Smokeless Tobacco, Cigars, and/or Pipes): Yes  Has patient been referred to the Quitline?: Patient refused referral  Patient has been referred for addiction treatment: Yes  Lynnell Chad, LCSW 03/31/2020, 9:27 AM

## 2020-03-31 NOTE — Discharge Summary (Signed)
Physician Discharge Summary Note  Patient:  Olivia Le is an 29 y.o., female MRN:  [id number] DOB:  June 05, 1991 Patient phone:  [phone] (home)  Patient address:   [address] [place] [address],  Total Time spent with patient: 15 minutes  Date of Admission:  03/28/2020 Date of Discharge: 03/31/20  Reason for Admission:  suicidal ideation  Principal Problem: MDD (major depressive disorder), single episode, severe (HCC) Discharge Diagnoses: Principal Problem:   MDD (major depressive disorder), single episode, severe (HCC)   Past Psychiatric History: No prior psychiatric admissions. No history of suicide attempts. No history of psychosis. Does not endorse history of PTSD. No clear history of mania or hypomania. Endorses history of depression and anxiety.   Past Medical History:  Past Medical History:  Diagnosis Date  . Kidney stone   . Polycystic disease, ovaries     Past Surgical History:  Procedure Laterality Date  . TONSILLECTOMY     Family History:  Family History  Problem Relation Age of Onset  . Hypertension Mother   . Healthy Father    Family Psychiatric  History: Mother and brother with depression Social History:  Social History   Substance and Sexual Activity  Alcohol Use Yes   Comment: weekly     Social History   Substance and Sexual Activity  Drug Use No    Social History   Socioeconomic History  . Marital status: Single    Spouse name: Not on file  . Number of children: Not on file  . Years of education: Not on file  . Highest education level: Not on file  Occupational History  . Not on file  Tobacco Use  . Smoking status: Never Smoker  . Smokeless tobacco: Never Used  Substance and Sexual Activity  . Alcohol use: Yes    Comment: weekly  . Drug use: No  . Sexual activity: Not on file  Other Topics Concern  . Not on file  Social History Narrative  . Not on file   Social Determinants of Health   Financial  Resource Strain:   . Difficulty of Paying Living Expenses:   Food Insecurity:   . Worried About Programme researcher, broadcasting/film/video in the Last Year:   . Barista in the Last Year:   Transportation Needs:   . Freight forwarder (Medical):   Marland Kitchen Lack of Transportation (Non-Medical):   Physical Activity:   . Days of Exercise per Week:   . Minutes of Exercise per Session:   Stress:   . Feeling of Stress :   Social Connections:   . Frequency of Communication with Friends and Family:   . Frequency of Social Gatherings with Friends and Family:   . Attends Religious Services:   . Active Member of Clubs or Organizations:   . Attends Banker Meetings:   Marland Kitchen Marital Status:     Hospital Course:  From admission H&P: 29 year old female, presented on 03/28/20 reporting depression with thoughts of drowning herself or cutting her wrists. Endorsed neurovegetative symptoms to include low energy, poor sleep, decreased appetite, anhedonia. In addition to depression also describes increased anxiety recently. She also reported increased frequency of alcohol consumption. Reports she drinks 2-3 times a week on average up to 4 drinks per sitting. (Denies any history of seizures or DTs, does sometimes feel "shaky" on days following alcohol consumption).She denies any drug abuse.Admission BAL 211, UDS positive for cannabis. Attributes depression in part to strained family relationship.She had recently  informed her parents about her sexual orientation and they did not take it well. She also lost her job recently. She had been treated with Effexor XR in the past but has not been on any medication for about a year. Denies medical illnesses, NKDA. Currently presents with slight distal tremors and feels subjectively flushed. No headache or visual disturbances at this time. Vitals are stable BP 121/84, pulse 95.   [name] was admitted for depression with suicidal ideation. She remained on the [facility] unit for  three days. She was started on Lexapro, gabapentin, and PRN Vistaril. She participated in group therapy on the unit. She responded well to treatment with no adverse effects reported. She has shown improved mood, affect, sleep, and interaction. She denies any SI/HI/AVH and contracts for safety. She is discharging on the medications listed below. She agrees to follow up at [facility] (see below). Patient is provided with prescriptions for medications upon discharge. Her mother is picking her up for discharge home.  Physical Findings: AIMS: Facial and Oral Movements Muscles of Facial Expression: None, normal Lips and Perioral Area: None, normal Jaw: None, normal Tongue: None, normal,Extremity Movements Upper (arms, wrists, hands, fingers): None, normal Lower (legs, knees, ankles, toes): None, normal, Trunk Movements Neck, shoulders, hips: None, normal, Overall Severity Severity of abnormal movements (highest score from questions above): None, normal Incapacitation due to abnormal movements: None, normal Patient's awareness of abnormal movements (rate only patient's report): No Awareness, Dental Status Current problems with teeth and/or dentures?: No Does patient usually wear dentures?: No  CIWA:  CIWA-Ar Total: 5 COWS:  COWS Total Score: 2  Musculoskeletal: Strength & Muscle Tone: within normal limits Gait & Station: normal Patient leans: N/A  Psychiatric Specialty Exam: Physical Exam  Nursing note and vitals reviewed. Constitutional: She is oriented to person, place, and time. She appears well-developed and well-nourished.  Cardiovascular: Normal rate.  Respiratory: Effort normal.  Neurological: She is alert and oriented to person, place, and time.    Review of Systems  Constitutional: Negative.   Respiratory: Negative for cough and shortness of breath.   Psychiatric/Behavioral: Negative for agitation, behavioral problems, confusion, dysphoric mood, hallucinations, self-injury, sleep  disturbance and suicidal ideas. The patient is not nervous/anxious and is not hyperactive.     Blood pressure (!) 122/94, pulse 68, temperature 98.5 F (36.9 C), temperature source Oral, resp. rate 16, height 5\' 6"  (1.676 m), weight 75.3 kg, SpO2 99 %.Body mass index is 26.79 kg/m.  See MD's discharge SRA    Have you used any form of tobacco in the last 30 days? (Cigarettes, Smokeless Tobacco, Cigars, and/or Pipes): Yes  Has this patient used any form of tobacco in the last 30 days? (Cigarettes, Smokeless Tobacco, Cigars, and/or Pipes) Yes, a prescription for an FDA-approved medication for tobacco cessation was offered at discharge.   Blood Alcohol level:  Lab Results  Component Value Date   ETH 211 (H) 03/28/2020    Metabolic Disorder Labs:  Lab Results  Component Value Date   HGBA1C 4.9 03/29/2020   MPG 93.93 03/29/2020   No results found for: PROLACTIN Lab Results  Component Value Date   CHOL 149 03/29/2020   TRIG 29 03/29/2020   HDL 97 03/29/2020   CHOLHDL 1.5 03/29/2020   VLDL 6 03/29/2020   LDLCALC 46 03/29/2020    See Psychiatric Specialty Exam and Suicide Risk Assessment completed by Attending Physician prior to discharge.  Discharge destination:  Home  Is patient on multiple antipsychotic therapies at discharge:  No   Has Patient had three or more failed trials of antipsychotic monotherapy by history:  No  Recommended Plan for Multiple Antipsychotic Therapies: NA  Discharge Instructions    Discharge instructions   Complete by: As directed    Patient is instructed to take all prescribed medications as recommended. Report any side effects or adverse reactions to your outpatient psychiatrist. Patient is instructed to abstain from alcohol and illegal drugs while on prescription medications. In the event of worsening symptoms, patient is instructed to call the crisis hotline, 911, or go to the nearest emergency department for evaluation and treatment.      Allergies as of 03/31/2020   No Known Allergies     Medication List    STOP taking these medications   acetaminophen 325 MG tablet Commonly known as: TYLENOL   cholecalciferol 25 MCG (1000 UNIT) tablet Commonly known as: VITAMIN D3   ferrous sulfate 325 (65 FE) MG tablet   ibuprofen 400 MG tablet Commonly known as: ADVIL   meloxicam 7.5 MG tablet Commonly known as: Mobic   metFORMIN 750 MG 24 hr tablet Commonly known as: GLUCOPHAGE-XR   methocarbamol 500 MG tablet Commonly known as: ROBAXIN   naproxen sodium 220 MG tablet Commonly known as: Aleve   spironolactone 100 MG tablet Commonly known as: ALDACTONE   venlafaxine XR 150 MG 24 hr capsule Commonly known as: EFFEXOR-XR     TAKE these medications     Indication  escitalopram 10 MG tablet Commonly known as: LEXAPRO Take 1 tablet (10 mg total) by mouth at bedtime.  Indication: Major Depressive Disorder   gabapentin 100 MG capsule Commonly known as: NEURONTIN Take 1 capsule (100 mg total) by mouth 3 (three) times daily.  Indication: Neuropathic Pain   hydrOXYzine 25 MG tablet Commonly known as: ATARAX/VISTARIL Take 1 tablet (25 mg total) by mouth every 6 (six) hours as needed for anxiety.  Indication: Feeling Anxious   nicotine 7 mg/24hr patch Commonly known as: NICODERM CQ - dosed in mg/24 hr Place 1 patch (7 mg total) onto the skin daily. Start taking on: Apr 01, 2020  Indication: Nicotine Addiction      Follow-up Information    [facility] Follow up on 04/10/2020.   Why: Your hospital discharge appointment is scheduled for Tuesday, 04/10/20 at 10:00am VIA TELEHEALTH. The provider will call you for this appointment. Please be sure to have your discharge paperwork available.  Contact information: [place]  [place] [address] [phone]           Follow-up recommendations: Activity as tolerated. Diet as recommended by primary care physician. Keep all scheduled follow-up  appointments as recommended.   Comments:   Patient is instructed to take all prescribed medications as recommended. Report any side effects or adverse reactions to your outpatient psychiatrist. Patient is instructed to abstain from alcohol and illegal drugs while on prescription medications. In the event of worsening symptoms, patient is instructed to call the crisis hotline, 911, or go to the nearest emergency department for evaluation and treatment.  Signed: Connye Burkitt, NP 03/31/2020, 9:37 AM

## 2020-03-31 NOTE — Progress Notes (Signed)
Patient has been in the milieu and attended all groups. Pleasant and cooperative and expressing readiness for discharge. Denying thoughts of self harm. Denying hallucinations. Discharge education completed. Pt discharged to home per MD recommendation. Agrees to comply with aftercare services. No sign of distress upon discharge.

## 2020-04-27 ENCOUNTER — Telehealth (INDEPENDENT_AMBULATORY_CARE_PROVIDER_SITE_OTHER): Payer: No Payment, Other | Admitting: Psychiatric/Mental Health

## 2020-04-27 ENCOUNTER — Other Ambulatory Visit: Payer: Self-pay

## 2020-04-27 DIAGNOSIS — F322 Major depressive disorder, single episode, severe without psychotic features: Secondary | ICD-10-CM | POA: Diagnosis not present

## 2020-04-27 DIAGNOSIS — F411 Generalized anxiety disorder: Secondary | ICD-10-CM | POA: Diagnosis not present

## 2020-04-27 DIAGNOSIS — F1020 Alcohol dependence, uncomplicated: Secondary | ICD-10-CM | POA: Insufficient documentation

## 2020-04-27 DIAGNOSIS — F1029 Alcohol dependence with unspecified alcohol-induced disorder: Secondary | ICD-10-CM | POA: Diagnosis not present

## 2020-04-27 MED ORDER — HYDROXYZINE HCL 25 MG PO TABS: 25.0000 mg | ORAL_TABLET | Freq: Four times a day (QID) | ORAL | 3 refills | Status: DC | PRN

## 2020-04-27 MED ORDER — ESCITALOPRAM OXALATE 20 MG PO TABS: 20.0000 mg | ORAL_TABLET | Freq: Every day | ORAL | 2 refills | Status: DC

## 2020-04-27 MED ORDER — GABAPENTIN 100 MG PO CAPS: 100.0000 mg | ORAL_CAPSULE | Freq: Three times a day (TID) | ORAL | 2 refills | Status: DC

## 2020-04-29 ENCOUNTER — Encounter (HOSPITAL_COMMUNITY): Payer: Self-pay | Admitting: Psychiatric/Mental Health

## 2020-04-29 NOTE — Progress Notes (Deleted)
BH MD/PA/NP OP Progress Note  04/29/2020 2:06 AM Olivia Le  MRN:  [id number]  Chief Complaint:  HPI: *** Visit Diagnosis:    ICD-10-CM   1. MDD (major depressive disorder), single episode, severe (HCC)  F32.2 escitalopram (LEXAPRO) 20 MG tablet  2. GAD (generalized anxiety disorder)  F41.1 hydrOXYzine (ATARAX/VISTARIL) 25 MG tablet  3. Alcohol dependence with unspecified alcohol-induced disorder (HCC)  F10.29 gabapentin (NEURONTIN) 100 MG capsule    Past Psychiatric History: ***  Past Medical History:  Past Medical History:  Diagnosis Date  . Kidney stone   . Polycystic disease, ovaries     Past Surgical History:  Procedure Laterality Date  . TONSILLECTOMY      Family Psychiatric History: ***  Family History:  Family History  Problem Relation Age of Onset  . Hypertension Mother   . Healthy Father     Social History:  Social History   Socioeconomic History  . Marital status: Single    Spouse name: Not on file  . Number of children: Not on file  . Years of education: Not on file  . Highest education level: Not on file  Occupational History  . Not on file  Tobacco Use  . Smoking status: Never Smoker  . Smokeless tobacco: Never Used  Vaping Use  . Vaping Use: Some days  . Substances: THC, CBD  . Devices: Jule  Substance and Sexual Activity  . Alcohol use: Yes    Comment: weekly  . Drug use: No  . Sexual activity: Not on file  Other Topics Concern  . Not on file  Social History Narrative  . Not on file   Social Determinants of Health   Financial Resource Strain:   . Difficulty of Paying Living Expenses:   Food Insecurity:   . Worried About Programme researcher, broadcasting/film/video in the Last Year:   . Barista in the Last Year:   Transportation Needs:   . Freight forwarder (Medical):   Marland Kitchen Lack of Transportation (Non-Medical):   Physical Activity:   . Days of Exercise per Week:   . Minutes of Exercise per Session:   Stress:   . Feeling of Stress :    Social Connections:   . Frequency of Communication with Friends and Family:   . Frequency of Social Gatherings with Friends and Family:   . Attends Religious Services:   . Active Member of Clubs or Organizations:   . Attends Banker Meetings:   Marland Kitchen Marital Status:     Allergies: No Known Allergies  Metabolic Disorder Labs: Lab Results  Component Value Date   HGBA1C 4.9 03/29/2020   MPG 93.93 03/29/2020   No results found for: PROLACTIN Lab Results  Component Value Date   CHOL 149 03/29/2020   TRIG 29 03/29/2020   HDL 97 03/29/2020   CHOLHDL 1.5 03/29/2020   VLDL 6 03/29/2020   LDLCALC 46 03/29/2020   Lab Results  Component Value Date   TSH 2.297 03/29/2020    Therapeutic Level Labs: No results found for: LITHIUM No results found for: VALPROATE No components found for:  CBMZ  Current Medications: Current Outpatient Medications  Medication Sig Dispense Refill  . escitalopram (LEXAPRO) 20 MG tablet Take 1 tablet (20 mg total) by mouth daily. 30 tablet 2  . gabapentin (NEURONTIN) 100 MG capsule Take 1 capsule (100 mg total) by mouth 3 (three) times daily. 90 capsule 2  . hydrOXYzine (ATARAX/VISTARIL) 25 MG tablet Take 1 tablet (25  mg total) by mouth every 6 (six) hours as needed for anxiety. 90 tablet 3  . nicotine (NICODERM CQ - DOSED IN MG/24 HR) 7 mg/24hr patch Place 1 patch (7 mg total) onto the skin daily. 28 patch 0   No current facility-administered medications for this visit.     Musculoskeletal: Strength & Muscle Tone: {desc; muscle tone:32375} Gait & Station: {PE GAIT ED NOBS:96283} Patient leans: {Patient Leans:21022755}  Psychiatric Specialty Exam: Review of Systems  There were no vitals taken for this visit.There is no height or weight on file to calculate BMI.  General Appearance: {Appearance:22683}  Eye Contact:  {BHH EYE CONTACT:22684}  Speech:  {Speech:22685}  Volume:  {Volume (PAA):22686}  Mood:  {BHH MOOD:22306}  Affect:   {Affect (PAA):22687}  Thought Process:  {Thought Process (PAA):22688}  Orientation:  {BHH ORIENTATION (PAA):22689}  Thought Content: {Thought Content:22690}   Suicidal Thoughts:  {ST/HT (PAA):22692}  Homicidal Thoughts:  {ST/HT (PAA):22692}  Memory:  {BHH MEMORY:22881}  Judgement:  {Judgement (PAA):22694}  Insight:  {Insight (PAA):22695}  Psychomotor Activity:  {Psychomotor (PAA):22696}  Concentration:  {Concentration:21399}  Recall:  {BHH GOOD/FAIR/POOR:22877}  Fund of Knowledge: {BHH GOOD/FAIR/POOR:22877}  Language: {BHH GOOD/FAIR/POOR:22877}  Akathisia:  {BHH YES OR NO:22294}  Handed:  {Handed:22697}  AIMS (if indicated): {Desc; done/not:10129}  Assets:  {Assets (PAA):22698}  ADL's:  {BHH MOQ'H:47654}  Cognition: {chl bhh cognition:304700322}  Sleep:  {BHH GOOD/FAIR/POOR:22877}   Screenings: AIMS     Admission (Discharged) from OP Visit from 03/28/2020 in [facility]  AIMS Total Score 0    AUDIT     Admission (Discharged) from OP Visit from 03/28/2020 in [facility]  Alcohol Use Disorder Identification Test Final Score (AUDIT) 10       Assessment and Plan: ***   Deloria Lair, NP 04/29/2020, 2:06 AM

## 2020-04-29 NOTE — Progress Notes (Signed)
Psychiatric Initial Adult Assessment   Virtual Visit via Video Note  I connected with Olivia Le on 04/27/20  by a video enabled telemedicine application and verified that I am speaking with the correct person using two identifiers.  Location: Patient: Home Provider: Clinic  I discussed the limitations of evaluation and management by telemedicine and the availability of in person appointments. The patient expressed understanding and agreed to proceed.  I provided 45 minutes of non-face-to-face time during this encounter.  Patient Identification: Olivia Le MRN:  [id number] Date of Evaluation:  04/29/2020 Referral Source: Vesta Mixer Chief Complaint:  " Its Been good" Visit Diagnosis:    ICD-10-CM   1. MDD (major depressive disorder), single episode, severe (HCC)  F32.2 escitalopram (LEXAPRO) 20 MG tablet  2. GAD (generalized anxiety disorder)  F41.1 hydrOXYzine (ATARAX/VISTARIL) 25 MG tablet  3. Alcohol dependence with unspecified alcohol-induced disorder (HCC)  F10.29 gabapentin (NEURONTIN) 100 MG capsule    History of Present Illness:  Olivia Le is a 29  year-old female seen today for initial psych evaluation.  Patient was referred to outpatient psychiatry by [facility] for medication management.  Patient has a history of major depressive disorder and general anxiety disorder and alcohol dependence disorder.  Patient presents today as a referral from [facility] for a med management appointment.  On assessment patient reports that her mood has been good she is newly recovering from alcohol and reports that the first few days have been rough.  She reports that although the first few days have been well with abstaining from alcohol it has improved her moods along with her medications.  She is currently taking 10 mg of Lexapro and she feels as though is just really scratching the surface of her symptoms.  She is also taking 25 mg of hydroxyzine to be used 2-3 times a day as needed for anxiety.   She is also being prescribed 100 mg of gabapentin to be taken 3 times a day which she says has helped her symptoms with alcohol withdrawal.  Patient has agreed to increase her Lexapro to 20 mg daily from 10 mg daily to see if this would further improve upon her depressive symptoms.  Patient denies SI, HI, and AVH.  Patient has agreed to follow-up in 8 weeks no further concerns at this time.  Associated Signs/Symptoms: Depression Symptoms:  na (Hypo) Manic Symptoms:  na Anxiety Symptoms:  Social Anxiety, Psychotic Symptoms:  na PTSD Symptoms: NA  Past Psychiatric History: yes  Previous Psychotropic Medications: Yes   Substance Abuse History in the last 12 months:  Yes.    Consequences of Substance Abuse: Medical Consequences:  hospitalizations  Past Medical History:  Past Medical History:  Diagnosis Date  . Kidney stone   . Polycystic disease, ovaries     Past Surgical History:  Procedure Laterality Date  . TONSILLECTOMY      Family Psychiatric History: unknown  Family History:  Family History  Problem Relation Age of Onset  . Hypertension Mother   . Healthy Father     Social History:   Social History   Socioeconomic History  . Marital status: Single    Spouse name: Not on file  . Number of children: Not on file  . Years of education: Not on file  . Highest education level: Not on file  Occupational History  . Not on file  Tobacco Use  . Smoking status: Never Smoker  . Smokeless tobacco: Never Used  Vaping Use  . Vaping Use: Some days  .  Substances: THC, CBD  . Devices: Jule  Substance and Sexual Activity  . Alcohol use: Yes    Comment: weekly  . Drug use: No  . Sexual activity: Not on file  Other Topics Concern  . Not on file  Social History Narrative  . Not on file   Social Determinants of Health   Financial Resource Strain:   . Difficulty of Paying Living Expenses:   Food Insecurity:   . Worried About Programme researcher, broadcasting/film/video in the Last Year:   .  Barista in the Last Year:   Transportation Needs:   . Freight forwarder (Medical):   Marland Kitchen Lack of Transportation (Non-Medical):   Physical Activity:   . Days of Exercise per Week:   . Minutes of Exercise per Session:   Stress:   . Feeling of Stress :   Social Connections:   . Frequency of Communication with Friends and Family:   . Frequency of Social Gatherings with Friends and Family:   . Attends Religious Services:   . Active Member of Clubs or Organizations:   . Attends Banker Meetings:   Marland Kitchen Marital Status:     Additional Social History: unknown  Allergies:  No Known Allergies  Metabolic Disorder Labs: Lab Results  Component Value Date   HGBA1C 4.9 03/29/2020   MPG 93.93 03/29/2020   No results found for: PROLACTIN Lab Results  Component Value Date   CHOL 149 03/29/2020   TRIG 29 03/29/2020   HDL 97 03/29/2020   CHOLHDL 1.5 03/29/2020   VLDL 6 03/29/2020   LDLCALC 46 03/29/2020   Lab Results  Component Value Date   TSH 2.297 03/29/2020    Therapeutic Level Labs: No results found for: LITHIUM No results found for: CBMZ No results found for: VALPROATE  Current Medications: Current Outpatient Medications  Medication Sig Dispense Refill  . escitalopram (LEXAPRO) 20 MG tablet Take 1 tablet (20 mg total) by mouth daily. 30 tablet 2  . gabapentin (NEURONTIN) 100 MG capsule Take 1 capsule (100 mg total) by mouth 3 (three) times daily. 90 capsule 2  . hydrOXYzine (ATARAX/VISTARIL) 25 MG tablet Take 1 tablet (25 mg total) by mouth every 6 (six) hours as needed for anxiety. 90 tablet 3  . nicotine (NICODERM CQ - DOSED IN MG/24 HR) 7 mg/24hr patch Place 1 patch (7 mg total) onto the skin daily. 28 patch 0   No current facility-administered medications for this visit.    Musculoskeletal: Strength & Muscle Tone: within normal limits Gait & Station: normal Patient leans: N/A  Psychiatric Specialty Exam: Review of Systems   Psychiatric/Behavioral: Negative for hallucinations, sleep disturbance and suicidal ideas. The patient is not nervous/anxious.   All other systems reviewed and are negative.   There were no vitals taken for this visit.There is no height or weight on file to calculate BMI.  General Appearance: Casual  Eye Contact:  Good  Speech:  Clear and Coherent  Volume:  Normal  Mood:  Euthymic  Affect:  Appropriate  Thought Process:  Coherent and Descriptions of Associations: Intact  Orientation:  Full (Time, Place, and Person)  Thought Content:  Logical  Suicidal Thoughts:  No  Homicidal Thoughts:  No  Memory:  Negative  Judgement:  Good  Insight:  Good  Psychomotor Activity:  Normal  Concentration:  Attention Span: Good  Recall:  Good  Fund of Knowledge:Good  Language: NA  Akathisia:  NA  Handed:  Right  AIMS (if  indicated):  not done  Assets:  Communication Skills Desire for Improvement Social Support Vocational/Educational  ADL's:  Intact  Cognition: WNL  Sleep:  Good   Screenings: AIMS     Admission (Discharged) from OP Visit from 03/28/2020 in [facility]  AIMS Total Score 0    AUDIT     Admission (Discharged) from OP Visit from 03/28/2020 in [facility]  Alcohol Use Disorder Identification Test Final Score (AUDIT) 10      Assessment and Plan: : Patient reports that she is doing well on current medication regimen.  She is agreeable to increase of Lexapro to 20mg  daily from 10mg  daily. Patient agrees to follow up in 8 weeks.   ICD-10-CM   1. MDD (major depressive disorder), single episode, severe (HCC)  F32.2 escitalopram (LEXAPRO) 20 MG tablet  2. GAD (generalized anxiety disorder)  F41.1 hydrOXYzine (ATARAX/VISTARIL) 25 MG tablet  3. Alcohol dependence with unspecified alcohol-induced disorder ([place])  F10.29 gabapentin (NEURONTIN) 100 MG capsule      Deloria Lair, NP 6/27/20212:06 AM

## 2020-05-10 ENCOUNTER — Ambulatory Visit (HOSPITAL_COMMUNITY): Payer: PRIVATE HEALTH INSURANCE | Admitting: Clinical

## 2020-06-19 ENCOUNTER — Emergency Department (HOSPITAL_COMMUNITY)
Admission: EM | Admit: 2020-06-19 | Discharge: 2020-06-21 | Disposition: A | Discharge status: Home / Self Care | Payer: PRIVATE HEALTH INSURANCE | Attending: Emergency Medicine | Admitting: Emergency Medicine

## 2020-06-19 ENCOUNTER — Ambulatory Visit (HOSPITAL_COMMUNITY)
Admission: EM | Admit: 2020-06-19 | Discharge: 2020-06-19 | Disposition: A | Discharge status: Other Acute Inpatient Hospital | Payer: No Payment, Other

## 2020-06-19 ENCOUNTER — Encounter (HOSPITAL_COMMUNITY): Payer: Self-pay | Admitting: Emergency Medicine

## 2020-06-19 ENCOUNTER — Other Ambulatory Visit: Payer: Self-pay

## 2020-06-19 DIAGNOSIS — F1090 Alcohol use, unspecified, uncomplicated: Secondary | ICD-10-CM

## 2020-06-19 DIAGNOSIS — F332 Major depressive disorder, recurrent severe without psychotic features: Secondary | ICD-10-CM

## 2020-06-19 DIAGNOSIS — F1020 Alcohol dependence, uncomplicated: Secondary | ICD-10-CM | POA: Diagnosis present

## 2020-06-19 DIAGNOSIS — F10220 Alcohol dependence with intoxication, uncomplicated: Secondary | ICD-10-CM

## 2020-06-19 DIAGNOSIS — Z789 Other specified health status: Secondary | ICD-10-CM

## 2020-06-19 DIAGNOSIS — Z7289 Other problems related to lifestyle: Secondary | ICD-10-CM

## 2020-06-19 DIAGNOSIS — R45851 Suicidal ideations: Secondary | ICD-10-CM

## 2020-06-19 DIAGNOSIS — Z20822 Contact with and (suspected) exposure to covid-19: Secondary | ICD-10-CM | POA: Insufficient documentation

## 2020-06-19 DIAGNOSIS — F411 Generalized anxiety disorder: Secondary | ICD-10-CM | POA: Diagnosis present

## 2020-06-19 NOTE — ED Provider Notes (Signed)
Behavioral Health Medical Screening Exam  Olivia Le is a 29 y.o. female with a history of MDD who presents voluntarily to [facility] with law enforcement due to suicidal thoughts with a plan to intentionally wreck her car. Patient reports that her roommate contacted her parents to tell them that she was having suicidal thoughts. Her parents visited her and contacted law enforcement. Patient reprots that she has been drinking wine and boot leggers 3-5 days a week "to help cope." She states that she receives medication mangement through [facility] and has a therapy appointment this Friday. Patient reports that she was at [facility] in May 2021 but did not feel it was helpful due to COVID restrictions. Discussed overnight observation with patient, she initially declined and requested that we contact her mother to develop a safety plan. Contacted the patient's mother along with TTS. Patient's mother reports that she requested the patient stay with her, but the patient refused to go to her house. She states that the patient made several suicidal statements this evening. Mother reports that the patient has been petitioned for involuntary commitment.   Total Time spent with patient: 30 minutes  Psychiatric Specialty Exam  Presentation  General Appearance:Appropriate for Environment;Casual;Neat  Eye Contact:Fair  Speech:Clear and Coherent;Normal Rate  Speech Volume:Normal  Handedness:No data recorded  Mood and Affect  Mood:Anxious;Depressed;Hopeless;Worthless  Affect:Congruent;Depressed   Thought Process  Thought Processes:Coherent;Linear;Goal Directed  Descriptions of Associations:Intact  Orientation:Full (Time, Place and Person)  Thought Content:Logical  Hallucinations:None  Ideas of Reference:None  Suicidal Thoughts:Yes, Active With Intent;With Plan;With Means to Carry Out  Homicidal Thoughts:No   Sensorium  Memory:Immediate Good;Recent Good;Remote  Good  Judgment:Impaired  Insight:Lacking   Executive Functions  Concentration:Fair  Attention Span:Fair  Recall:Good  Fund of Knowledge:Good  Language:Good   Psychomotor Activity  Psychomotor Activity:Normal   Assets  Assets:Communication Skills;Desire for Improvement;Housing;Leisure Time;Physical Health   Sleep  Sleep:Good  Number of hours: No data recorded  Physical Exam: Physical Exam Constitutional:      General: She is not in acute distress.    Appearance: She is obese. She is not ill-appearing, toxic-appearing or diaphoretic.  HENT:     Head: Normocephalic.     Right Ear: External ear normal.     Left Ear: External ear normal.  Eyes:     Pupils: Pupils are equal, round, and reactive to light.  Cardiovascular:     Rate and Rhythm: Normal rate.  Pulmonary:     Effort: Pulmonary effort is normal. No respiratory distress.  Skin:    General: Skin is warm and dry.  Neurological:     Mental Status: She is alert and oriented to person, place, and time.  Psychiatric:        Mood and Affect: Mood is anxious and depressed.        Thought Content: Thought content is not paranoid or delusional. Thought content includes suicidal ideation. Thought content does not include homicidal ideation. Thought content includes suicidal plan.    Review of Systems  Constitutional: Negative for chills, diaphoresis, fever, malaise/fatigue and weight loss.  HENT: Negative for congestion.   Respiratory: Negative for cough and shortness of breath.   Cardiovascular: Negative for chest pain and palpitations.  Gastrointestinal: Negative for diarrhea, nausea and vomiting.  Neurological: Negative for dizziness and seizures.  Psychiatric/Behavioral: Positive for depression, substance abuse and suicidal ideas. Negative for hallucinations and memory loss. The patient is nervous/anxious. The patient does not have insomnia.   All other systems reviewed and are negative.  Blood pressure  121/87, pulse (!) 102, temperature 99.2 F (37.3 C), resp. rate 18, SpO2 98 %. There is no height or weight on file to calculate BMI.  Musculoskeletal: Strength & Muscle Tone: within normal limits Gait & Station: normal Patient leans: N/A   Recommendations:  Based on my evaluation the patient does not appear to have an emergency medical condition.  Disposition: Recommend psychiatric Inpatient admission when medically cleared.   Jackelyn Poling, NP 06/19/2020, 11:45 PM

## 2020-06-19 NOTE — BH Assessment (Signed)
Comprehensive Clinical Assessment (CCA) Note  06/20/2020 Autumn MessingGretchen Phegley [id number]   [facility] plan to drive her car into "something". During assessment patient stated "I wish I had when ahead and done it" referencing crashing her car. Patient reported onset 3 weeks ago. Trigger/stressers being friends/family relationships. Patient reported worsening depressive symptoms. Patient reported "my friends called my parents as an intervention for me to get help and my parents then called crisis line and then the cops". [facility] due to SI with plans. Patient denied prior suicide attempts and self-harming behaviors. Patient reported normal sleep and appetite. Patient was cooperative during assessment.   [facility]. Patient reported she is consistent with taking medications, however they are not working. Patient resides with roommate. Patient is unemployed. Patient denied access to guns.   Collateral Contact Roderic Scarcerina Laskin, mother, [phone], reported that she was currently at the magistrates completing IVC paperwork on patient. Mother reported patient was belligerent earlier today stating she was going to kill herself by driving her car into a pillar or a wall. Mother suggested patient come stay with her for observation and safety, mother reported patient stating, "if I come with you I will sneak away and kill myself". Mother reported patient made several suicide statements this evening.   Disposition Nira ConnJason Berry, recommended inpatient treatment. Patient refused stating she will go stay with mother. However mother petitioned patient, patient is in process of being served. SW will secure placement for patient.   Visit Diagnosis:      ICD-10-CM   1. Severe episode of recurrent major depressive disorder, without psychotic features (HCC)  F33.2        CCA Screening, Triage and Referral (STR)  Patient Reported Information How did you hear about us? Family/Friend  Referral name: No data recorded Referral phone number: No data recorded  Whom do you see for routine medical problems? Primary Care  [facility]ility [facility]/ Dr. Billee CashingSarah Spencer  Practice/Facility Phone Number: No data recorded Name of Contact: No data recorded Contact Number: No data recorded Contact Fax Number: No data recorded Prescriber Name: No data recorded Prescriber Address (if known): No data recorded  What Is the Reason for Your Visit/Call Today? "SI with plan to drive car into something"  How Long Has This Been Causing You Problems? 1 wk - 1 month  What Do You Feel Would Help You the Most Today? Medication   Have You Recently Been in Any Inpatient Treatment (Hospital/Detox/Crisis Center/28-Day Program)? Yes  [facility]  How Long Were You There? 5/26-5/29  When Were You Discharged? 03/31/20   [facility] You [facility]? Yes  [facility]   Have You Recently Had Any Thoughts About Hurting Yourself? Yes  Are You Planning to Commit Suicide/Harm Yourself At This time? Yes   Have you Recently Had Thoughts About Hurting Someone Karolee Ohslse? No  Explanation: No data recorded  Have You Used Any Alcohol or Drugs in the Past 24 Hours? Yes  How Long Ago Did You Use Drugs or Alcohol? 1800  What Did You Use and How Much? alcohol, 3 glasses wine   Do You Currently Have a Therapist/Psychiatrist? Yes  [facility]iatrist[facility]   Have You Been Recently Discharged From Any Office Practice or Programs? No  Explanation of Discharge From  Practice/Program: No data recorded    CCA Screening Triage Referral Assessment Type of Contact: Face-to-Face  Is this Initial or Reassessment? No data recorded Date  Telepsych consult ordered in CHL:  No data recorded Time Telepsych consult ordered in CHL:  No data recorded  Patient Reported Information Reviewed? Yes  Patient Left Without Being Seen? No data recorded Reason for Not Completing Assessment: No data recorded  Collateral Involvement: Anastacia Reinecke, mother, [phone]   Does Patient Have a Court Appointed Legal Guardian? No data recorded Name and Contact of Legal Guardian: No data recorded If Minor and Not Living with Parent(s), Who has Custody? No data recorded Is CPS involved or ever been involved? Never  Is APS involved or ever been involved? Never   Patient Determined To Be At Risk for Harm To Self or Others Based on Review of Patient Reported Information or Presenting Complaint? Yes, for Self-Harm  Method: No data recorded Availability of Means: No data recorded Intent: No data recorded Notification Required: No data recorded Additional Information for Danger to Others Potential: No data recorded Additional Comments for Danger to Others Potential: No data recorded Are There Guns or Other Weapons in Your Home? No  Types of Guns/Weapons: No data recorded Are These Weapons Safely Secured?                            No data recorded Who Could Verify You Are Able To Have These Secured: No data recorded Do You Have any Outstanding Charges, Pending Court Dates, Parole/Probation? No data recorded Contacted To Inform of Risk of Harm To Self or Others: Patent examiner;Family/Significant Other:   [facility] of Assessment: [facility]   Does Patient Present under Involuntary Commitment? No  IVC Papers Initial File Date: No data recorded  [place] of Residence: [place]   Patient Currently Receiving the Following Services: Medication Management;Individual Therapy   Determination of Need: Emergent (2 hours)   Options For Referral: Inpatient Hospitalization     CCA Biopsychosocial  Intake/Chief Complaint:   CCA Intake With Chief Complaint Chief Complaint/Presenting Problem: "SI with plan to drive car into something" Patient's Currently Reported Symptoms/Problems: "SI with plan to drive car into something"  Mental Health Symptoms Depression:  Depression: Increase/decrease in appetite, Change in energy/activity, Worthlessness, Duration of symptoms greater than two weeks, Sleep (too much or little), Fatigue, Hopelessness, Tearfulness  Mania:     Anxiety:   Anxiety: Sleep, Irritability, Tension, Restlessness  Psychosis:  Psychosis: None  Trauma:  Trauma: None  Obsessions:  Obsessions: None  Compulsions:  Compulsions: None  Inattention:  Inattention: None  Hyperactivity/Impulsivity:  Hyperactivity/Impulsivity: N/A  Oppositional/Defiant Behaviors:  Oppositional/Defiant Behaviors: N/A  Emotional Irregularity:  Emotional Irregularity: N/A  Other Mood/Personality Symptoms:      Mental Status Exam Appearance and self-care  Stature:  Stature: Average  Weight:  Weight: Average weight  Clothing:  Clothing: Age-appropriate  Grooming:  Grooming: Normal  Cosmetic use:  Cosmetic Use: Age appropriate  Posture/gait:  Posture/Gait: Normal  Motor activity:  Motor Activity: Not Remarkable  Sensorium  Attention:  Attention: Normal  Concentration:  Concentration: Normal  Orientation:  Orientation: Person, Place, Situation, Time  Recall/memory:  Recall/Memory: Normal  Affect and Mood  Affect:  Affect: Anxious, Appropriate  Mood:  Mood: Anxious  Relating  Eye contact:  Eye Contact: Normal  Facial expression:  Facial Expression: Anxious  Attitude toward examiner:     Thought and Language  Speech flow: Speech Flow:  Normal  Thought content:  Thought Content: Appropriate to Mood and Circumstances  Preoccupation:  Preoccupations: Suicide  Hallucinations:  Hallucinations: None  Organization:     Company secretary of Knowledge:  Fund of Knowledge: Fair  Intelligence:  Intelligence: Average   Abstraction:     Judgement:  Judgement: Poor  Reality Testing:     Insight:  Insight: Lacking  Decision Making:  Decision Making: Impulsive  Social Functioning  Social Maturity:     Social Judgement:     Stress  Stressors:  Stressors: Family conflict  Coping Ability:  Coping Ability: Building surveyor Deficits:     Supports:  Supports: Family     Religion: Religion/Spirituality Are You A Religious Person?:  (n/a)  Leisure/Recreation:    Exercise/Diet: Exercise/Diet Have You Gained or Lost A Significant Amount of Weight in the Past Six Months?: No Do You Follow a Special Diet?: No Do You Have Any Trouble Sleeping?: No   CCA Employment/Education  Employment/Work Situation: Employment / Work Situation Employment situation: Biomedical scientist job has been impacted by current illness: No What is the longest time patient has a held a job?: n/a Where was the patient employed at that time?: n/a Has patient ever been in the [employer]?: No  Education: Education Is Patient Currently Attending School?: No   CCA Family/Childhood History  Family and Relationship History: Family history What is your sexual orientation?: n/a Has your sexual activity been affected by drugs, alcohol, medication, or emotional stress?: n/a Does patient have children?: No  Childhood History:  Childhood History Does patient have siblings?:  (n/a) Did patient suffer any verbal/emotional/physical/sexual abuse as a child?: No Did patient suffer from severe childhood neglect?: No Has patient ever been sexually abused/assaulted/raped as an adolescent or adult?: No Was the patient ever a victim of a crime or a disaster?: No Witnessed domestic violence?: No Has patient been affected by domestic violence as an adult?: No  Child/Adolescent Assessment:     CCA Substance Use  Alcohol/Drug Use: Alcohol / Drug Use Pain Medications: see MAR Prescriptions: see MAR Over the Counter: see  MAR History of alcohol / drug use?: Yes Substance #1 Name of Substance 1: alcohol 1 - Amount (size/oz): 4-5 glasses of wine 1 - Frequency: 3 days weekly 1 - Last Use / Amount: yesterday at 6pm                       ASAM's:  Six Dimensions of Multidimensional Assessment  Dimension 1:  Acute Intoxication and/or Withdrawal Potential:      Dimension 2:  Biomedical Conditions and Complications:      Dimension 3:  Emotional, Behavioral, or Cognitive Conditions and Complications:     Dimension 4:  Readiness to Change:     Dimension 5:  Relapse, Continued use, or Continued Problem Potential:     Dimension 6:  Recovery/Living Environment:     ASAM Severity Score:    ASAM Recommended Level of Treatment:     Substance use Disorder (SUD)    Recommendations for Services/Supports/Treatments:    DSM5 Diagnoses: Patient Active Problem List   Diagnosis Date Noted  . GAD (generalized anxiety disorder) 04/27/2020  . Alcohol dependence (HCC) 04/27/2020  . MDD (major depressive disorder), single episode, severe (HCC) 03/28/2020    Patient Centered Plan: Patient is on the following Treatment Plan(s):     Referrals to Alternative Service(s): Referred to Alternative Service(s):   Place:   Date:   Time:  Referred to Alternative Service(s):   Place:   Date:   Time:    Referred to Alternative Service(s):   Place:   Date:   Time:    Referred to Alternative Service(s):   Place:   Date:   Time:     Daylene Posey AlstonComprehensive Clinical Assessment (CCA) Screening, Triage and Referral Note  06/20/2020 Kymani Laursen [id number]  Visit Diagnosis:    ICD-10-CM   1. Severe episode of recurrent major depressive disorder, without psychotic features (HCC)  F33.2     Patient Reported Information How did you hear about [place]? Family/Friend   Referral name: No data recorded  Referral phone number: No data recorded Whom do you see for routine medical problems? Primary  Care   Practice/Facility Name: [facility]/ Dr. Billee Cashing   Practice/Facility Phone Number: No data recorded  Name of Contact: No data recorded  Contact Number: No data recorded  Contact Fax Number: No data recorded  Prescriber Name: No data recorded  Prescriber Address (if known): No data recorded What Is the Reason for Your Visit/Call Today? "SI with plan to drive car into something"  How Long Has This Been Causing You Problems? 1 wk - 1 month  Have You Recently Been in Any Inpatient Treatment (Hospital/Detox/Crisis Center/28-Day Program)? Yes   Name/Location of Program/Hospital:[facility]   How Long Were You There? 5/26-5/29   When Were You Discharged? 03/31/20  Have You Ever Received Services From [facility] Before? Yes   [facility]  Have You Recently Had Any Thoughts About Hurting Yourself? Yes   Are You Planning to Commit Suicide/Harm Yourself At This time?  Yes  Have you Recently Had Thoughts About Hurting Someone Karolee Ohs? No   Explanation: No data recorded Have You Used Any Alcohol or Drugs in the Past 24 Hours? Yes   How Long Ago Did You Use Drugs or Alcohol?  1800   What Did You Use and How Much? alcohol, 3 glasses wine  What Do You Feel Would Help You the Most Today? Medication  Do You Currently Have a Therapist/Psychiatrist? Yes   [facility]rist[facility]   Have You Been Recently Discharged From Any Office Practice or Programs? No   Explanation of Discharge From Practice/Program:  No data recorded    CCA Screening Triage Referral Assessment Type of Contact: Face-to-Face   Is this Initial or Reassessment? No data recorded  Date Telepsych consult ordered in CHL:  No data recorded  Time Telepsych consult ordered in CHL:  No data recorded Patient Reported Information Reviewed? Yes   Patient Left Without Being Seen? No data recorded  Reason for Not Completing Assessment: No  data recorded Collateral Involvement: Amaiah Cristiano, mother, [phone]  Does Patient Have a Court Appointed Legal Guardian? No data recorded  Name and Contact of Legal Guardian:  No data recorded If Minor and Not Living with Parent(s), Who has Custody? No data recorded Is CPS involved or ever been involved? Never  Is APS involved or ever been involved? Never  Patient Determined To Be At Risk for Harm To Self or Others Based on Review of Patient Reported Information or Presenting Complaint? Yes, for Self-Harm   Method: No data recorded  Availability of Means: No data recorded  Intent: No data recorded  Notification Required: No data recorded  Additional Information for Danger to Others Potential:  No data recorded  Additional Comments for Danger to Others Potential:  No data  recorded  Are There Guns or Other Weapons in Your Home?  No    Types of Guns/Weapons: No data recorded   Are These Weapons Safely Secured?                              No data recorded   Who Could Verify You Are Able To Have These Secured:    No data recorded Do You Have any Outstanding Charges, Pending Court Dates, Parole/Probation? No data recorded Contacted To Inform of Risk of Harm To Self or Others: Patent examiner;Family/Significant Other:  Location of Assessment: [facility]  Does Patient Present under Involuntary Commitment? No   IVC Papers Initial File Date: No data recorded  [place] of Residence: [place]  Patient Currently Receiving the Following Services: Medication Management;Individual Therapy   Determination of Need: Emergent (2 hours)   Options For Referral: Inpatient Hospitalization   Burnetta Sabin, [facility]

## 2020-06-19 NOTE — ED Triage Notes (Signed)
BIB [employer] from [facility] under IVC. Patient was taken voluntary to [facility] d/t SI and aggression towards family. Patient mother took out IVC and patient was brought here. Calm and cooperative in triage.

## 2020-06-19 NOTE — ED Notes (Signed)
PT NOW IVCED BY MOTHER, RELEASED TO [employer] CUSTODY.

## 2020-06-20 LAB — I-STAT BETA HCG BLOOD, ED (MC, WL, AP ONLY): I-stat hCG, quantitative: <5 m[IU]/mL (ref <5)

## 2020-06-20 LAB — COMPREHENSIVE METABOLIC PANEL
ALT: 19 U/L (ref 0–44)
AST: 22 U/L (ref 15–41)
Albumin: 4.1 g/dL (ref 3.5–5.0)
Alkaline Phosphatase: 80 U/L (ref 38–126)
Anion gap: 10 (ref 5–15)
BUN: 10 mg/dL (ref 6–20)
CO2: 25 mmol/L (ref 22–32)
Calcium: 8.6 mg/dL — ABNORMAL LOW (ref 8.9–10.3)
Chloride: 104 mmol/L (ref 98–111)
Creatinine, Ser: 0.76 mg/dL (ref 0.44–1.00)
GFR calc Af Amer: >60 mL/min (ref >60)
GFR calc non Af Amer: >60 mL/min (ref >60)
Glucose, Bld: 109 mg/dL — ABNORMAL HIGH (ref 70–99)
Potassium: 4.1 mmol/L (ref 3.5–5.1)
Sodium: 139 mmol/L (ref 135–145)
Total Bilirubin: 0.3 mg/dL (ref 0.3–1.2)
Total Protein: 7.6 g/dL (ref 6.5–8.1)

## 2020-06-20 LAB — SALICYLATE LEVEL: Salicylate Lvl: <7 mg/dL — ABNORMAL LOW (ref 7.0–30.0)

## 2020-06-20 LAB — RAPID URINE DRUG SCREEN, HOSP PERFORMED
Amphetamines: NOT DETECTED
Barbiturates: NOT DETECTED
Benzodiazepines: NOT DETECTED
Cocaine: NOT DETECTED
Opiates: NOT DETECTED
Tetrahydrocannabinol: NOT DETECTED

## 2020-06-20 LAB — CBC
HCT: 41.2 % (ref 36.0–46.0)
Hemoglobin: 13.3 g/dL (ref 12.0–15.0)
MCH: 28.7 pg (ref 26.0–34.0)
MCHC: 32.3 g/dL (ref 30.0–36.0)
MCV: 89 fL (ref 80.0–100.0)
Platelets: 348 10*3/uL (ref 150–400)
RBC: 4.63 MIL/uL (ref 3.87–5.11)
RDW: 14.7 % (ref 11.5–15.5)
WBC: 9.9 10*3/uL (ref 4.0–10.5)
nRBC: 0 % (ref 0.0–0.2)

## 2020-06-20 LAB — ACETAMINOPHEN LEVEL: Acetaminophen (Tylenol), Serum: <10 ug/mL — ABNORMAL LOW (ref 10–30)

## 2020-06-20 LAB — ETHANOL: Alcohol, Ethyl (B): 207 mg/dL — ABNORMAL HIGH (ref <10)

## 2020-06-20 MED ORDER — THIAMINE HCL 100 MG PO TABS
100.0000 mg | ORAL_TABLET | Freq: Every day | ORAL | Status: DC
  Administered 2020-06-20 – 2020-06-21 (×3): 100 mg via ORAL
  Filled 2020-06-20 (×4): qty 1

## 2020-06-20 MED ORDER — LORAZEPAM 1 MG PO TABS
1.0000 mg | ORAL_TABLET | Freq: Once | ORAL | Status: AC
  Administered 2020-06-20: 1 mg via ORAL
  Filled 2020-06-20: qty 1

## 2020-06-20 MED ORDER — THIAMINE HCL 100 MG/ML IJ SOLN: 100.0000 mg | Freq: Every day | INTRAMUSCULAR | Status: DC

## 2020-06-20 MED ORDER — LORAZEPAM 1 MG PO TABS: 0.0000 mg | ORAL_TABLET | Freq: Two times a day (BID) | ORAL | Status: DC

## 2020-06-20 MED ORDER — HYDROXYZINE HCL 25 MG PO TABS
25.0000 mg | ORAL_TABLET | Freq: Once | ORAL | Status: AC
  Administered 2020-06-20: 25 mg via ORAL
  Filled 2020-06-20: qty 1

## 2020-06-20 MED ORDER — ACETAMINOPHEN 500 MG PO TABS
1000.0000 mg | ORAL_TABLET | Freq: Once | ORAL | Status: AC
  Administered 2020-06-20: 1000 mg via ORAL
  Filled 2020-06-20: qty 2

## 2020-06-20 MED ORDER — LORAZEPAM 2 MG/ML IJ SOLN: 0.0000 mg | Freq: Four times a day (QID) | INTRAMUSCULAR | Status: DC

## 2020-06-20 MED ORDER — LORAZEPAM 1 MG PO TABS
0.0000 mg | ORAL_TABLET | Freq: Four times a day (QID) | ORAL | Status: DC
  Administered 2020-06-20 – 2020-06-21 (×4): 1 mg via ORAL
  Filled 2020-06-20 (×4): qty 1

## 2020-06-20 MED ORDER — HYDROXYZINE HCL 25 MG PO TABS
25.0000 mg | ORAL_TABLET | Freq: Every evening | ORAL | Status: DC | PRN
  Administered 2020-06-20: 25 mg via ORAL
  Filled 2020-06-20: qty 1

## 2020-06-20 MED ORDER — LORAZEPAM 2 MG/ML IJ SOLN: 0.0000 mg | Freq: Two times a day (BID) | INTRAMUSCULAR | Status: DC

## 2020-06-20 MED ORDER — HYDROXYZINE HCL 25 MG PO TABS: 25.0000 mg | ORAL_TABLET | ORAL | Status: DC | PRN

## 2020-06-20 NOTE — Progress Notes (Addendum)
Pt meets inpatient criteria per Nira Conn, NP. Referral information has been sent to the following hospitals for review:  [facility]  [facility]  [facility]  [facility]  [facility]  [facility]  [facility]  [facility]  [facility]  [facility]  [facility]    [facility]  Disposition will continue to follow for inpatient placement needs.   Wells Guiles, LCSW, LCAS Disposition CSW [facility] [phone]

## 2020-06-20 NOTE — ED Provider Notes (Signed)
IVC completed for patient safety.  Providing prn vistaril for sleep.    Tilden Fossa, MD 06/20/20 [id number]

## 2020-06-20 NOTE — ED Notes (Signed)
Patient fled through triage during back door while RN was rounding in lobby. [employer] and Security aware and outside hospital looking for patient

## 2020-06-20 NOTE — ED Notes (Signed)
Patient was found by [employer] at gas station. Brought back in. Father at bedside

## 2020-06-20 NOTE — ED Notes (Signed)
Breakfast Ordered 

## 2020-06-20 NOTE — ED Notes (Signed)
Belongings removed. Placed in scrubs. Wanded by security

## 2020-06-20 NOTE — ED Provider Notes (Signed)
[facility] EMERGENCY DEPARTMENT Provider Note   CSN: [id number] Arrival date & time: 06/19/20  2351     History Chief Complaint  Patient presents with  . IVC    Olivia Le is a 29 y.o. female.  Patient presents to the emergency department with a chief complaint of suicidal thoughts.  She is here under IVC.  IVC papers completed by her father.  Patient states that she got into multiple arguments today and said that she could not go on living like this.  She states that her father overreacted had her committed.  Her father is with her, and now is interested in resending the IVC papers, but does not know how to accomplish this.  Patient would like to be here voluntarily to be seen by psychiatry and have her medications adjusted.  She states that she is not currently feeling suicidal, but that she does get suicidal when she gets angry and depressed.  This is further compounded by alcohol abuse.  She denies any recreational drug use.  Denies any recent illnesses.  The history is provided by the patient. No language interpreter was used.       Past Medical History:  Diagnosis Date  . Kidney stone   . Polycystic disease, ovaries     Patient Active Problem List   Diagnosis Date Noted  . GAD (generalized anxiety disorder) 04/27/2020  . Alcohol dependence (HCC) 04/27/2020  . MDD (major depressive disorder), single episode, severe (HCC) 03/28/2020    Past Surgical History:  Procedure Laterality Date  . TONSILLECTOMY       OB History   No obstetric history on file.     Family History  Problem Relation Age of Onset  . Hypertension Mother   . Healthy Father     Social History   Tobacco Use  . Smoking status: Never Smoker  . Smokeless tobacco: Never Used  Vaping Use  . Vaping Use: Some days  . Substances: THC, CBD  . Devices: Jule  Substance Use Topics  . Alcohol use: Yes    Comment: weekly  . Drug use: No    Home Medications Prior to  Admission medications   Medication Sig Start Date End Date Taking? Authorizing Provider  escitalopram (LEXAPRO) 20 MG tablet Take 1 tablet (20 mg total) by mouth daily. 04/27/20 07/26/20  Jearld Lesch, NP  gabapentin (NEURONTIN) 100 MG capsule Take 1 capsule (100 mg total) by mouth 3 (three) times daily. 04/27/20 07/26/20  Jearld Lesch, NP  hydrOXYzine (ATARAX/VISTARIL) 25 MG tablet Take 1 tablet (25 mg total) by mouth every 6 (six) hours as needed for anxiety. 04/27/20 07/26/20  Jearld Lesch, NP  nicotine (NICODERM CQ - DOSED IN MG/24 HR) 7 mg/24hr patch Place 1 patch (7 mg total) onto the skin daily. 04/01/20   Aldean Baker, NP    Allergies    Patient has no known allergies.  Review of Systems   Review of Systems  All other systems reviewed and are negative.   Physical Exam Updated Vital Signs BP 123/86 (BP Location: Right Arm)   Pulse 96   Temp 98.5 F (36.9 C) (Oral)   Resp 16   Ht 5\' 6"  (1.676 m)   Wt 75.3 kg   SpO2 100%   BMI 26.79 kg/m   Physical Exam Vitals and nursing note reviewed.  Constitutional:      General: She is not in acute distress.    Appearance: She is well-developed.  HENT:     Head: Normocephalic and atraumatic.  Eyes:     Conjunctiva/sclera: Conjunctivae normal.  Cardiovascular:     Rate and Rhythm: Normal rate and regular rhythm.     Heart sounds: No murmur heard.   Pulmonary:     Effort: Pulmonary effort is normal. No respiratory distress.     Breath sounds: Normal breath sounds.  Abdominal:     Palpations: Abdomen is soft.     Tenderness: There is no abdominal tenderness.  Musculoskeletal:        General: Normal range of motion.     Cervical back: Neck supple.  Skin:    General: Skin is warm and dry.  Neurological:     Mental Status: She is alert and oriented to person, place, and time.  Psychiatric:        Mood and Affect: Mood normal.        Behavior: Behavior normal.     ED Results / Procedures / Treatments    Labs (all labs ordered are listed, but only abnormal results are displayed) Labs Reviewed  COMPREHENSIVE METABOLIC PANEL - Abnormal; Notable for the following components:      Result Value   Glucose, Bld 109 (*)    Calcium 8.6 (*)    All other components within normal limits  ETHANOL - Abnormal; Notable for the following components:   Alcohol, Ethyl (B) 207 (*)    All other components within normal limits  SALICYLATE LEVEL - Abnormal; Notable for the following components:   Salicylate Lvl <7.0 (*)    All other components within normal limits  ACETAMINOPHEN LEVEL - Abnormal; Notable for the following components:   Acetaminophen (Tylenol), Serum <10 (*)    All other components within normal limits  CBC  RAPID URINE DRUG SCREEN, HOSP PERFORMED  I-STAT BETA HCG BLOOD, ED ([facility], [facility], [facility] ONLY)    EKG None  Radiology No results found.  Procedures Procedures (including critical care time)  Medications Ordered in ED Medications  LORazepam (ATIVAN) injection 0-4 mg (has no administration in time range)    Or  LORazepam (ATIVAN) tablet 0-4 mg (has no administration in time range)  LORazepam (ATIVAN) injection 0-4 mg (has no administration in time range)    Or  LORazepam (ATIVAN) tablet 0-4 mg (has no administration in time range)  thiamine tablet 100 mg (has no administration in time range)    Or  thiamine (B-1) injection 100 mg (has no administration in time range)    ED Course  I have reviewed the triage vital signs and the nursing notes.  Pertinent labs & imaging results that were available during my care of the patient were reviewed by me and considered in my medical decision making (see chart for details).    MDM Rules/Calculators/A&P                          Patient here under IVC by her father for suicidal thoughts.  She is calm and cooperative now.  She would like to have her medications reviewed by psychiatry, as her current regimen is not working for her.  She would  like to move on with her father and is not interested in inpatient treatment, but remains under IVC.  Her father seems amenable to having her move back home as well.    We will consult TTS/psychiatry for further evaluation and advice.  Patient appears medically stable for TTS evaluation.  TTS has seen  the patient, initial recommendation is for inpatient therapy.  However, father now states that he wishes that the IVC papers could be rescinded and that the patient could come home with him.  I discussed this with Samantha at behavioral health, who advised that EDP could rescind the papers, but she would advise against this.  She states that the patient will be reassessed in the morning by the psychiatry team and could possibly be cleared by psychiatry in the morning.  I discussed this plan with the patient.  She is agreeable with this.  Final Clinical Impression(s) / ED Diagnoses Final diagnoses:  Suicidal thoughts    Rx / DC Orders ED Discharge Orders    None       Roxy Horseman, PA-C 06/20/20 0416    Nira Conn, MD 06/20/20 [phone]

## 2020-06-20 NOTE — ED Notes (Signed)
Pt reports she has to give up drinking. She is just stupid and gets depressed and says she wants to die when she does but does not really want to die

## 2020-06-21 ENCOUNTER — Encounter (HOSPITAL_COMMUNITY): Payer: Self-pay | Admitting: *Deleted

## 2020-06-21 DIAGNOSIS — N946 Dysmenorrhea, unspecified: Secondary | ICD-10-CM | POA: Insufficient documentation

## 2020-06-21 DIAGNOSIS — F411 Generalized anxiety disorder: Secondary | ICD-10-CM

## 2020-06-21 DIAGNOSIS — F10220 Alcohol dependence with intoxication, uncomplicated: Secondary | ICD-10-CM

## 2020-06-21 DIAGNOSIS — F909 Attention-deficit hyperactivity disorder, unspecified type: Secondary | ICD-10-CM | POA: Insufficient documentation

## 2020-06-21 LAB — SARS CORONAVIRUS 2 BY RT PCR (HOSPITAL ORDER, PERFORMED IN ~~LOC~~ HOSPITAL LAB): SARS Coronavirus 2: NEGATIVE

## 2020-06-21 MED ORDER — HYDROXYZINE HCL 25 MG PO TABS: 25.0000 mg | ORAL_TABLET | Freq: Four times a day (QID) | ORAL | Status: DC | PRN

## 2020-06-21 MED ORDER — MIRTAZAPINE 7.5 MG PO TABS: 7.5000 mg | ORAL_TABLET | Freq: Every day | ORAL | 0 refills | Status: DC

## 2020-06-21 MED ORDER — ESCITALOPRAM OXALATE 10 MG PO TABS
20.0000 mg | ORAL_TABLET | Freq: Every day | ORAL | Status: DC
  Administered 2020-06-21: 20 mg via ORAL
  Filled 2020-06-21: qty 2

## 2020-06-21 MED ORDER — GABAPENTIN 100 MG PO CAPS
100.0000 mg | ORAL_CAPSULE | Freq: Three times a day (TID) | ORAL | Status: DC
  Administered 2020-06-21: 100 mg via ORAL
  Filled 2020-06-21: qty 1

## 2020-06-21 MED ORDER — IBUPROFEN 200 MG PO TABS: 600.0000 mg | ORAL_TABLET | Freq: Four times a day (QID) | ORAL | Status: DC | PRN

## 2020-06-21 MED ORDER — TRAZODONE HCL 50 MG PO TABS
50.0000 mg | ORAL_TABLET | Freq: Every evening | ORAL | 0 refills | Status: DC | PRN
Start: 2020-06-21 — End: 2020-06-25

## 2020-06-21 NOTE — ED Notes (Signed)
IVC paperwork rescinded - Copy faxed to [employer] - Copy sent to Medical Records - Original placed in folder for [employer].

## 2020-06-21 NOTE — ED Provider Notes (Signed)
Emergency Medicine Observation Re-evaluation Note  Olivia Le is a 29 y.o. female, seen on rounds today.  Pt initially presented to the ED for complaints of IVC Currently, the patient is resting comfortably in bed.  Physical Exam  BP 113/83   Pulse 72   Temp 98.7 F (37.1 C) (Oral)   Resp 16   Ht 5\' 6"  (1.676 m)   Wt 75.3 kg   SpO2 100%   BMI 26.79 kg/m  Physical Exam General: Laying in bed in no distress Cardiac: Normal rate skin is normal color no cyanosis Lungs: No respiratory distress respirations even unlabored Psych: calm requesting her home meds ordered and PRN medications for anxiety  ED Course / MDM  EKG:    I have reviewed the labs performed to date as well as medications administered while in observation.  Recent changes in the last 24 hours include None.  Plan  Current plan is for Home meds re-evaluation by psych Patient is under full IVC at this time.   , PA-C 06/21/20 1159    06/23/20, MD 06/24/20 [phone]

## 2020-06-21 NOTE — Discharge Instructions (Signed)
Today you received medications that may make you sleepy or impair your ability to make decisions.  For the next 24 hours please do not drive, operate heavy machinery, care for a small child with out another adult present, or perform any activities that may cause harm to you or someone else if you were to fall asleep or be impaired.   

## 2020-06-21 NOTE — ED Notes (Signed)
D/C instructions given and discussed - Pt voiced understanding of instructions and to pick up rx's and follow up's. Pt denies SI/HI. ID arm band removed - Pt escorted to mother's vehicle.

## 2020-06-21 NOTE — ED Notes (Signed)
Pt given Ativan as requested d/t c/o feeling anxious - Aware waiting for re-assessment. Pt eating lunch.
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
## 2020-06-21 NOTE — ED Notes (Signed)
Pt made phone call from phone at nurses' desk - Pt voiced understanding may have 2 phone calls/day.

## 2020-06-21 NOTE — ED Notes (Signed)
Pt states she may need her meds adjusted - states she takes Vyvanse and another med at night and Lexapro and Neurontin during the day. Advised BH pt needs psych meds ordered.

## 2020-06-21 NOTE — Progress Notes (Addendum)
Pt has been psychiatrically cleared. Pt's mother has been notified and will come to [facility] ED to pick pt up within the hour. CSW spoke with pt who voiced interest in residential treatment at [facility]. She agreed for this Clinical research associate to fax referral information to [facility] for review and she will follow up with the agency once she gets home.    Wells Guiles, LCSW, LCAS Disposition CSW [facility] [phone]

## 2020-06-21 NOTE — ED Notes (Signed)
Pt voiced understanding of tx plan - d/c to home w/resources and updated meds. Mother called and advised she is here to pick up pt. Advised mother paperwork will need to be completed then will escort pt out. Pt aware. ALL belongings - 1 labeled belongings bag - returned to pt - Pt signed verifying all items present.

## 2020-06-21 NOTE — ED Notes (Signed)
Ordered [place] °

## 2020-06-22 ENCOUNTER — Telehealth (HOSPITAL_COMMUNITY): Payer: PRIVATE HEALTH INSURANCE

## 2020-06-22 ENCOUNTER — Other Ambulatory Visit: Payer: Self-pay

## 2020-06-22 ENCOUNTER — Ambulatory Visit (INDEPENDENT_AMBULATORY_CARE_PROVIDER_SITE_OTHER): Payer: No Payment, Other | Admitting: Clinical

## 2020-06-22 DIAGNOSIS — F322 Major depressive disorder, single episode, severe without psychotic features: Secondary | ICD-10-CM

## 2020-06-22 DIAGNOSIS — F1020 Alcohol dependence, uncomplicated: Secondary | ICD-10-CM

## 2020-06-23 NOTE — Progress Notes (Signed)
° °  THERAPIST PROGRESS NOTE  Session Time: 53 minutes  Participation Level: Active  Behavioral Response: CasualAlertDepressed  Type of Therapy: Individual Therapy  Treatment Goals addressed: Diagnosis: Depression  Interventions: Supportive  Summary:  Olivia Le is a 29 y.o. female who presents in person for the scheduled session presented oriented times five, appropriately dressed, and friendly. Client denied hallucinations and delusions. Client presents for follow up outpatient services following discharge from [facility] hospital on 06/19/20 for alcohol intoxication and suicidal thoughts. Client reported a history of depression and anxiety starting in highschool feeling ashamed because she was not as successful in her school performance. Client also reported her use of alcohol began in highschool.  Client reported she has been coping with her depression by using alcohol.  Client reported in the past six months drinking three or four days a week, drinking three or four drinks or bootleggers. Client reported experiencing shakiness/ hangover the next day and would drink that away. Client reported the day of her  hospitalization she drank a bottle of wine.  Client stated, "I was feel low anxious depressed and alone, once I start drinking I drink more and I had plan to die, I had reached out to friends and a roommate, my parents showed up to my house. They had an intervention and took me to the hospital". Client reported reported her last two hospitalizations were triggered by feelings of not belonging and coming to terms that she is Bisexual and knowing her family does not support that lifestyle. Client reported her parents and two brothers are committee to helping her get into treatment and want the best for her. Client reported she is going to move in with her parents while she works on stabilizing herself. Client reported she is actively seeking to admit herself in a residential program to address  her alcohol use and depression. Client reported she would like to return for therapy until she is able to find a program.     Counselor from 06/22/2020 in [facility]  PHQ-9 Total Score 16       Suicidal/Homicidal: Nowithout intent/plan  Therapist Response:  Therapist rendered the session as a follow up check in since the clients hospital discharge.  Therapist made introduction and discussed confidentiality. Therapist asked open ended questions to assess the clients current mental health symptoms and medication compliance. Therapist engaged the client in a discussion asking her to detail her history of depression and alcohol use. Therapist completed the depression treatment plan with the clients input. Therapist provided the client with information to residential treatment programs to call.   Therapist assisted with scheduling the client for next therapy appointment.     Plan: Return again in 4 weeks for individual therapy.  Diagnosis: Major depressive episode, single episode, severe  Neena Rhymes Jevon Shells, LCSW 06/23/2020

## 2020-06-24 ENCOUNTER — Telehealth (HOSPITAL_COMMUNITY): Payer: PRIVATE HEALTH INSURANCE | Admitting: Adult Health

## 2020-06-25 ENCOUNTER — Encounter (HOSPITAL_COMMUNITY): Payer: Self-pay | Admitting: Psychiatry

## 2020-06-25 ENCOUNTER — Telehealth (INDEPENDENT_AMBULATORY_CARE_PROVIDER_SITE_OTHER): Payer: No Payment, Other | Admitting: Psychiatry

## 2020-06-25 ENCOUNTER — Other Ambulatory Visit: Payer: Self-pay

## 2020-06-25 DIAGNOSIS — F411 Generalized anxiety disorder: Secondary | ICD-10-CM

## 2020-06-25 DIAGNOSIS — F900 Attention-deficit hyperactivity disorder, predominantly inattentive type: Secondary | ICD-10-CM | POA: Diagnosis not present

## 2020-06-25 DIAGNOSIS — F331 Major depressive disorder, recurrent, moderate: Secondary | ICD-10-CM

## 2020-06-25 DIAGNOSIS — F1020 Alcohol dependence, uncomplicated: Secondary | ICD-10-CM

## 2020-06-25 MED ORDER — ESCITALOPRAM OXALATE 10 MG PO TABS: 20.0000 mg | ORAL_TABLET | Freq: Every day | ORAL | 2 refills | Status: DC

## 2020-06-25 MED ORDER — GABAPENTIN 400 MG PO CAPS: 400.0000 mg | ORAL_CAPSULE | Freq: Every day | ORAL | 2 refills | Status: DC

## 2020-06-25 MED ORDER — TRAZODONE HCL 50 MG PO TABS: 50.0000 mg | ORAL_TABLET | Freq: Every evening | ORAL | 0 refills | Status: DC | PRN

## 2020-06-25 MED ORDER — MIRTAZAPINE 15 MG PO TABS: 15.0000 mg | ORAL_TABLET | Freq: Every day | ORAL | 2 refills | Status: DC

## 2020-06-25 MED ORDER — HYDROXYZINE HCL 25 MG PO TABS: 25.0000 mg | ORAL_TABLET | Freq: Four times a day (QID) | ORAL | 3 refills | Status: DC | PRN

## 2020-06-25 MED ORDER — ATOMOXETINE HCL 40 MG PO CAPS: 40.0000 mg | ORAL_CAPSULE | Freq: Every day | ORAL | 2 refills | Status: DC

## 2020-06-25 NOTE — Progress Notes (Signed)
BH MD/PA/NP OP Progress Note Virtual Visit via Video Note  I connected with Olivia Le on 06/25/20 at  3:00 PM EDT by a video enabled telemedicine application and verified that I am speaking with the correct person using two identifiers.  Location: Patient: Home Provider: Clinic   I discussed the limitations of evaluation and management by telemedicine and the availability of in person appointments. The patient expressed understanding and agreed to proceed.  I provided 45 minutes of non-face-to-face time during this encounter.    06/25/2020 4:27 PM Olivia Le  MRN:  [id number]  Chief Complaint: "My depression is 9/10 most days"  HPI: 29 year old female seen today for follow-up psychiatric evaluation.  Patient was [facility] ED on 06/19/2020 with worsening depression and suicidal ideation.  Patient notes that at that time she was intoxicated and threatened to take her life by driving her car off a bridge or into a wall.  She has a psychiatric history of anxiety, depression, ADHD, alcohol dependence, and SI.  She is currently being managed on Remeron 7.5 mg at bedtime, trazodone 50 mg at bedtime, hydroxyzine 25 mg every 6 hours as needed, Lexapro 20 mg daily, and gabapentin 100 mg 3 times daily.  Today patient endorses depressive symptoms such as anhedonia, anxiety, poor concentration, psychomotor agitation, and passive SI.  Today she denies SI/HI/VH however notes at times she has passive suicidal ideations. She contracts for safety today.  Patient also notes that at times she has increasing distractibility.  She notes that in the past she was diagnosed with ADHD however notes that she has not taken medication since her school years.  She endorses inability to pay close attention to details, problems completing task, difficulty paying attention when spoken to directly, being disorganized, avoiding tasks that require mental effort, and being forgetful.  She informed Clinical research associatewriter  that she will be leaving for treatment of her alcohol use tomorrow.  She notes that she will be staying at [facility].  She notes that this has caused her increased anxiety however she notes that she is hopeful that she will be successful in the program.  She informed provider that feels like gabapentin has been somewhat effective in her anxiety as well as her alcohol dependence.  She requests that gabapentin be increased at this time.  Patient informed Clinical research associatewriter that she works as a Customer service managerreal estate agent.  She notes however that her depression has interfered with her activities of daily living and her work life.  Patient notes that she would like to be more mentally stable in order to effectively function in life.  She is agreeable to increasing Remeron 7.5 mg to 15 mg to help improve symptoms of depression.  She is also agreeable to starting Strattera 40 mg to help improve symptoms of ADHD.  Patient is also agreeable to increasing gabapentin milligrams 3 times daily to 400 mg daily to help improve symptoms of anxiety as well as reduce alcohol dependence. Potential side effects of medication and risks vs benefits of treatment vs non-treatment were explained and discussed. All questions were answered.  She will continue all other medications as prescribed.  Concerns noted at this time. Visit Diagnosis:    ICD-10-CM   1. Moderate episode of recurrent major depressive disorder (HCC)  F33.1 escitalopram (LEXAPRO) 10 MG tablet    traZODone (DESYREL) 50 MG tablet    mirtazapine (REMERON) 15 MG tablet  2. GAD (generalized anxiety disorder)  F41.1 hydrOXYzine (ATARAX/VISTARIL) 25 MG tablet  gabapentin (NEURONTIN) 400 MG capsule  3. Attention deficit hyperactivity disorder (ADHD), predominantly inattentive type  F90.0 atomoxetine (STRATTERA) 40 MG capsule  4. Alcohol use disorder, moderate, dependence (HCC)  F10.20 gabapentin (NEURONTIN) 400 MG capsule    Past Psychiatric History: Anxiety,  depression, ADHD, and SI  Past Medical History:  Past Medical History:  Diagnosis Date  . Kidney stone   . Polycystic disease, ovaries     Past Surgical History:  Procedure Laterality Date  . TONSILLECTOMY      Family Psychiatric History: Brother Depression and mother depression  Family History:  Family History  Problem Relation Age of Onset  . Hypertension Mother   . Healthy Father     Social History:  Social History   Socioeconomic History  . Marital status: Single    Spouse name: Not on file  . Number of children: Not on file  . Years of education: Not on file  . Highest education level: Not on file  Occupational History  . Not on file  Tobacco Use  . Smoking status: Never Smoker  . Smokeless tobacco: Never Used  Vaping Use  . Vaping Use: Some days  . Substances: THC, CBD  . Devices: Jule  Substance and Sexual Activity  . Alcohol use: Yes    Comment: weekly  . Drug use: No  . Sexual activity: Not on file  Other Topics Concern  . Not on file  Social History Narrative  . Not on file   Social Determinants of Health   Financial Resource Strain:   . Difficulty of Paying Living Expenses: Not on file  Food Insecurity:   . Worried About Programme researcher, broadcasting/film/video in the Last Year: Not on file  . Ran Out of Food in the Last Year: Not on file  Transportation Needs:   . Lack of Transportation (Medical): Not on file  . Lack of Transportation (Non-Medical): Not on file  Physical Activity:   . Days of Exercise per Week: Not on file  . Minutes of Exercise per Session: Not on file  Stress:   . Feeling of Stress : Not on file  Social Connections:   . Frequency of Communication with Friends and Family: Not on file  . Frequency of Social Gatherings with Friends and Family: Not on file  . Attends Religious Services: Not on file  . Active Member of Clubs or Organizations: Not on file  . Attends Banker Meetings: Not on file  . Marital Status: Not on file     Allergies: No Known Allergies  Metabolic Disorder Labs: Lab Results  Component Value Date   HGBA1C 4.9 03/29/2020   MPG 93.93 03/29/2020   No results found for: PROLACTIN Lab Results  Component Value Date   CHOL 149 03/29/2020   TRIG 29 03/29/2020   HDL 97 03/29/2020   CHOLHDL 1.5 03/29/2020   VLDL 6 03/29/2020   LDLCALC 46 03/29/2020   Lab Results  Component Value Date   TSH 2.297 03/29/2020    Therapeutic Level Labs: No results found for: LITHIUM No results found for: VALPROATE No components found for:  CBMZ  Current Medications: Current Outpatient Medications  Medication Sig Dispense Refill  . atomoxetine (STRATTERA) 40 MG capsule Take 1 capsule (40 mg total) by mouth daily. 30 capsule 2  . escitalopram (LEXAPRO) 10 MG tablet Take 2 tablets (20 mg total) by mouth daily. 30 tablet 2  . gabapentin (NEURONTIN) 400 MG capsule Take 1 capsule (400 mg total) by  mouth daily. 30 capsule 2  . hydrOXYzine (ATARAX/VISTARIL) 25 MG tablet Take 1 tablet (25 mg total) by mouth every 6 (six) hours as needed for anxiety. 90 tablet 3  . mirtazapine (REMERON) 15 MG tablet Take 1 tablet (15 mg total) by mouth at bedtime. 30 tablet 2  . traZODone (DESYREL) 50 MG tablet Take 1 tablet (50 mg total) by mouth at bedtime as needed for sleep. 30 tablet 0   No current facility-administered medications for this visit.     Musculoskeletal: Strength & Muscle Tone: Unable to assess due to telehealth visit.  Gait & Station: Unable to assess due to telehealth visit.  Patient leans: N/A  Psychiatric Specialty Exam: Review of Systems  There were no vitals taken for this visit.There is no height or weight on file to calculate BMI.  General Appearance: Well Groomed  Eye Contact:  Good  Speech:  Clear and Coherent and Normal Rate  Volume:  Normal  Mood:  Angry and Depressed  Affect:  Congruent  Thought Process:  Coherent, Goal Directed and Linear  Orientation:  Full (Time, Place, and  Person)  Thought Content: WDL and Logical   Suicidal Thoughts:  No  Homicidal Thoughts:  No  Memory:  Immediate;   Good Recent;   Good Remote;   Good  Judgement:  Fair  Insight:  Fair  Psychomotor Activity:  Normal  Concentration:  Concentration: Fair and Attention Span: Fair  Recall:  Good  Fund of Knowledge: Good  Language: Good  Akathisia:  No  Handed:  Right  AIMS (if indicated): Not done  Assets:  Communication Skills Desire for Improvement Financial Resources/Insurance Housing Social Support  ADL's:  Intact  Cognition: WNL  Sleep:  Good   Screenings: AIMS     [facility]  AIMS Total Score 0    AUDIT     Admission (Discharged) [facility]  Alcohol Use Disorder Identification Test Final Score (AUDIT) 10    PHQ2-9     Counselor from 06/22/2020 in [facility]  PHQ-2 Total Score 4  PHQ-9 Total Score 16       Assessment and Plan: Patient endorsing symptoms of anxiety, depression, ADHD, and alcohol dependence.  She is agreeable to increase Remeron to 7.5 mg to 15 mg to help improve symptoms of depression.  She is also agreeable to increasing gabapentin 100 mg 3 times daily to 400 mg daily to help manage alcohol dependence and anxiety.  She is also agreeable to starting Strattera 40 mg daily to help improve symptoms of ADHD.  She will continue all other medications as prescribed.  1. GAD (generalized anxiety disorder)  Continue- hydrOXYzine (ATARAX/VISTARIL) 25 MG tablet; Take 1 tablet (25 mg total) by mouth every 6 (six) hours as needed for anxiety.  Dispense: 90 tablet; Refill: 3 Increased- gabapentin (NEURONTIN) 400 MG capsule; Take 1 capsule (400 mg total) by mouth daily.  Dispense: 30 capsule; Refill: 2  2. Attention deficit hyperactivity disorder (ADHD), predominantly inattentive  type  Start- atomoxetine (STRATTERA) 40 MG capsule; Take 1 capsule (40 mg total) by mouth daily.  Dispense: 30 capsule; Refill: 2  3. Moderate episode of recurrent major depressive disorder (HCC)  Continue- escitalopram (LEXAPRO) 10 MG tablet; Take 2 tablets (20 mg total) by mouth daily.  Dispense: 30 tablet; Refill: 2 Continue- traZODone (DESYREL) 50 MG tablet; Take 1 tablet (  50 mg total) by mouth at bedtime as needed for sleep.  Dispense: 30 tablet; Refill: 0 Increased- mirtazapine (REMERON) 15 MG tablet; Take 1 tablet (15 mg total) by mouth at bedtime.  Dispense: 30 tablet; Refill: 2  4. Alcohol use disorder, moderate, dependence (HCC)  Inreased- gabapentin (NEURONTIN) 400 MG capsule; Take 1 capsule (400 mg total) by mouth daily.  Dispense: 30 capsule; Refill: 2  Follow-up in 2 months Follow-up with therapy   Shanna Cisco, NP 06/25/2020, 4:27 PM

## 2020-07-13 ENCOUNTER — Other Ambulatory Visit (HOSPITAL_COMMUNITY): Payer: Self-pay | Admitting: Psychiatry

## 2020-07-13 DIAGNOSIS — F331 Major depressive disorder, recurrent, moderate: Secondary | ICD-10-CM

## 2020-08-21 ENCOUNTER — Ambulatory Visit (INDEPENDENT_AMBULATORY_CARE_PROVIDER_SITE_OTHER): Payer: No Payment, Other | Admitting: Clinical

## 2020-08-21 ENCOUNTER — Other Ambulatory Visit: Payer: Self-pay

## 2020-08-21 DIAGNOSIS — F322 Major depressive disorder, single episode, severe without psychotic features: Secondary | ICD-10-CM | POA: Diagnosis not present

## 2020-08-23 ENCOUNTER — Other Ambulatory Visit: Payer: Self-pay

## 2020-08-23 ENCOUNTER — Encounter (HOSPITAL_COMMUNITY): Payer: Self-pay | Admitting: Psychiatry

## 2020-08-23 ENCOUNTER — Telehealth (INDEPENDENT_AMBULATORY_CARE_PROVIDER_SITE_OTHER): Payer: No Payment, Other | Admitting: Psychiatry

## 2020-08-23 DIAGNOSIS — F411 Generalized anxiety disorder: Secondary | ICD-10-CM | POA: Diagnosis not present

## 2020-08-23 DIAGNOSIS — F900 Attention-deficit hyperactivity disorder, predominantly inattentive type: Secondary | ICD-10-CM | POA: Insufficient documentation

## 2020-08-23 DIAGNOSIS — F331 Major depressive disorder, recurrent, moderate: Secondary | ICD-10-CM | POA: Diagnosis not present

## 2020-08-23 MED ORDER — QUETIAPINE FUMARATE 100 MG PO TABS: 100.0000 mg | ORAL_TABLET | Freq: Every day | ORAL | 2 refills | Status: DC

## 2020-08-23 MED ORDER — GABAPENTIN 400 MG PO CAPS: 400.0000 mg | ORAL_CAPSULE | Freq: Every day | ORAL | 2 refills | Status: DC

## 2020-08-23 MED ORDER — TRAZODONE HCL 50 MG PO TABS: 50.0000 mg | ORAL_TABLET | Freq: Every evening | ORAL | 2 refills | Status: DC | PRN

## 2020-08-23 MED ORDER — ATOMOXETINE HCL 80 MG PO CAPS: 80.0000 mg | ORAL_CAPSULE | Freq: Every day | ORAL | 2 refills | Status: DC

## 2020-08-23 MED ORDER — ESCITALOPRAM OXALATE 10 MG PO TABS: 20.0000 mg | ORAL_TABLET | Freq: Every day | ORAL | 2 refills | Status: DC

## 2020-08-23 MED ORDER — HYDROXYZINE HCL 25 MG PO TABS: 25.0000 mg | ORAL_TABLET | Freq: Four times a day (QID) | ORAL | 2 refills | Status: DC | PRN

## 2020-08-23 NOTE — Progress Notes (Signed)
   THERAPIST PROGRESS NOTE  Session Time: 45 minutes  Participation Level: Active  Behavioral Response: CasualAlertEuthymic  Type of Therapy: Individual Therapy  Treatment Goals addressed: Diagnosis: depression  Interventions: CBT  Summary:  Olivia Le is a 29 y.o. female who presents for the scheduled session oriented times five, appropriately dressed, and friendly. Client denied hallucinations and delusions. Client reported feeling better on today. Client reported since she was last seen she completed a 28 day treatment program at [facility] in [place] to help address her alcohol use. Client reported she completed individual and group therapy which she found to be very beneficial. Client reported through therapy at [facility] he incorporated her family in some sessions to address conflict with her parents about acceptance of her sexuality. Client reported her parents are very religious and although she is not they have a good relationship. Client discussed otherwise she has settled back into living on her own again very well and finding a routine between and work and self care. Client reported she is working part time and spending other time exercising and doing meditations which have helped to improve her anxiety and depressive symptoms.    Suicidal/Homicidal: Nowithout intent/plan  Therapist Response:  Therapist began the session by checking in with the client to discuss updates since last seen. Therapist actively listened to the clients thoughts and feelings. Therapist CBT by collaborating with the client to discuss changes of her thought processing since completing treatment and evaluating effectiveness of taught coping skills currently. Therapist used CBT to introduce cognitive distortions and provided the client with a handout explaining them for her to use in homework. Therapist assigned the client homework to journal her thoughts and begin to identify  thinking errors and distortions. Therapist assisted with scheduling next appointments.    Plan: Return again in 3 weeks for individua therapy.  Diagnosis: Major depressive disorder, single episode, severe  Olivia Rhymes Shelsie Tijerino, LCSW 08/23/2020

## 2020-08-23 NOTE — Progress Notes (Signed)
BH MD/PA/NP OP Progress Note Virtual Visit via Video Note  I connected with Olivia Le on 08/23/20 at 11:30 AM EDT by a video enabled telemedicine application and verified that I am speaking with the correct person using two identifiers.  Location: Patient: Home Provider: Clinic   I discussed the limitations of evaluation and management by telemedicine and the availability of in person appointments. The patient expressed understanding and agreed to proceed.  I provided 30 minutes of non-face-to-face time during this encounter.    08/23/2020 12:15 PM Olivia Le  MRN:  [id number]  Chief Complaint: "The Strattera is so good. I came off the Remeron because it heightened appitites".  HPI: 29 year old female seen today for follow-up psychiatric evaluation.  She has a psychiatric history of anxiety, depression, ADHD, alcohol dependence, and SI.  She is currently being managed on Remeron 15 mg at bedtime, trazodone 50 mg at bedtime, hydroxyzine 25 mg every 6 hours as needed, Lexapro 20 mg daily, and gabapentin 400 mg daily.  He notes that while in treatment for alcohol dependence she was started on Seroquel 100 mg and notes that her Strattera dose was increased to 80 mg.  She also informed provider that she discontinued her Remeron because it was increasing her appetite.  Today she is pleasant, cooperative, engaged in conversation, and maintained eye contact.  She informed provider that while being at [facility] her life was changed.  She notes that the program saved her life.  She noted that she has been sober from alcohol since 8/20/202.  She informed provider that she occasionally has symptoms of anxiety depression however notes that her medications are managing her psychiatric conditions well.  Provider conducted a GAD-7 and patient scored a 9.  Provider also conducted a PHQ-9 and patient scored a 12 she denies SI/HI/AVH or paranoia.  No medication changes made today.  Patient  agreeable to continue medications as prescribed.  Follow-up with provider in 3 months.  No other concerns noted at this time.  Visit Diagnosis:    ICD-10-CM   1. Moderate episode of recurrent major depressive disorder (HCC)  F33.1 traZODone (DESYREL) 50 MG tablet    escitalopram (LEXAPRO) 10 MG tablet    QUEtiapine (SEROQUEL) 100 MG tablet  2. GAD (generalized anxiety disorder)  F41.1 hydrOXYzine (ATARAX/VISTARIL) 25 MG tablet    gabapentin (NEURONTIN) 400 MG capsule  3. Attention deficit hyperactivity disorder (ADHD), predominantly inattentive type  F90.0 atomoxetine (STRATTERA) 80 MG capsule    Past Psychiatric History: Anxiety, depression, ADHD, and SI  Past Medical History:  Past Medical History:  Diagnosis Date  . Kidney stone   . Polycystic disease, ovaries     Past Surgical History:  Procedure Laterality Date  . TONSILLECTOMY      Family Psychiatric History: Brother Depression and mother depression  Family History:  Family History  Problem Relation Age of Onset  . Hypertension Mother   . Healthy Father     Social History:  Social History   Socioeconomic History  . Marital status: Single    Spouse name: Not on file  . Number of children: Not on file  . Years of education: Not on file  . Highest education level: Not on file  Occupational History  . Not on file  Tobacco Use  . Smoking status: Never Smoker  . Smokeless tobacco: Never Used  Vaping Use  . Vaping Use: Some days  . Substances: THC, CBD  . Devices: Jule  Substance and Sexual Activity  .  Alcohol use: Yes    Comment: weekly  . Drug use: No  . Sexual activity: Not on file  Other Topics Concern  . Not on file  Social History Narrative  . Not on file   Social Determinants of Health   Financial Resource Strain:   . Difficulty of Paying Living Expenses: Not on file  Food Insecurity:   . Worried About Programme researcher, broadcasting/film/video in the Last Year: Not on file  . Ran Out of Food in the Last Year: Not  on file  Transportation Needs:   . Lack of Transportation (Medical): Not on file  . Lack of Transportation (Non-Medical): Not on file  Physical Activity:   . Days of Exercise per Week: Not on file  . Minutes of Exercise per Session: Not on file  Stress:   . Feeling of Stress : Not on file  Social Connections:   . Frequency of Communication with Friends and Family: Not on file  . Frequency of Social Gatherings with Friends and Family: Not on file  . Attends Religious Services: Not on file  . Active Member of Clubs or Organizations: Not on file  . Attends Banker Meetings: Not on file  . Marital Status: Not on file    Allergies: No Known Allergies  Metabolic Disorder Labs: Lab Results  Component Value Date   HGBA1C 4.9 03/29/2020   MPG 93.93 03/29/2020   No results found for: PROLACTIN Lab Results  Component Value Date   CHOL 149 03/29/2020   TRIG 29 03/29/2020   HDL 97 03/29/2020   CHOLHDL 1.5 03/29/2020   VLDL 6 03/29/2020   LDLCALC 46 03/29/2020   Lab Results  Component Value Date   TSH 2.297 03/29/2020    Therapeutic Level Labs: No results found for: LITHIUM No results found for: VALPROATE No components found for:  CBMZ  Current Medications: Current Outpatient Medications  Medication Sig Dispense Refill  . QUEtiapine (SEROQUEL) 100 MG tablet Take 1 tablet (100 mg total) by mouth at bedtime. 30 tablet 2  . atomoxetine (STRATTERA) 80 MG capsule Take 1 capsule (80 mg total) by mouth daily. 30 capsule 2  . escitalopram (LEXAPRO) 10 MG tablet Take 2 tablets (20 mg total) by mouth daily. 30 tablet 2  . gabapentin (NEURONTIN) 400 MG capsule Take 1 capsule (400 mg total) by mouth daily. 30 capsule 2  . hydrOXYzine (ATARAX/VISTARIL) 25 MG tablet Take 1 tablet (25 mg total) by mouth every 6 (six) hours as needed for anxiety. 90 tablet 2  . traZODone (DESYREL) 50 MG tablet Take 1 tablet (50 mg total) by mouth at bedtime as needed for sleep. 30 tablet 2   No  current facility-administered medications for this visit.     Musculoskeletal: Strength & Muscle Tone: Unable to assess due to telehealth visit.  Gait & Station: Unable to assess due to telehealth visit.  Patient leans: N/A  Psychiatric Specialty Exam: Review of Systems  There were no vitals taken for this visit.There is no height or weight on file to calculate BMI.  General Appearance: Well Groomed  Eye Contact:  Good  Speech:  Clear and Coherent and Normal Rate  Volume:  Normal  Mood:  Euthymic  Affect:  Congruent  Thought Process:  Coherent, Goal Directed and Linear  Orientation:  Full (Time, Place, and Person)  Thought Content: WDL and Logical   Suicidal Thoughts:  No  Homicidal Thoughts:  No  Memory:  Immediate;   Good Recent;  Good Remote;   Good  Judgement:  Good  Insight:  Good  Psychomotor Activity:  Normal  Concentration:  Concentration: Good and Attention Span: Good  Recall:  Good  Fund of Knowledge: Good  Language: Good  Akathisia:  No  Handed:  Right  AIMS (if indicated): Not done  Assets:  Communication Skills Desire for Improvement Financial Resources/Insurance Housing Social Support  ADL's:  Intact  Cognition: WNL  Sleep:  Good   Screenings: AIMS     Ad[facility] (Discharged) [facility]  AIMS Total Score 0    AUDIT     Admission (Discharged) from [facility]  Alcohol Use Disorder Identification Test Final Score (AUDIT) 10    GAD-7     Video Visit from 08/23/2020 in [facility]  Total GAD-7 Score 9    PHQ2-9     Video Visit from 08/23/2020 in [facility] Counselor from 06/22/2020 in [facility]  PHQ-2 Total Score 3 4  PHQ-9 Total Score 12 16       Assessment and Plan: Patient reports that she is doing well on her current  medication regimen.  She informed provider that Strattera 40 mg was increased to 80 mg while at [facility].  She also notes that she discontinued Remeron because it was increasing her appetite and noted that she was started on Seroquel 100 mg at [facility].  No medication changes made today.  Patient agreeable to continue medication as prescribed.  1. Moderate episode of recurrent major depressive disorder (HCC)  Contunue- traZODone (DESYREL) 50 MG tablet; Take 1 tablet (50 mg total) by mouth at bedtime as needed for sleep.  Dispense: 30 tablet; Refill: 2 Continue- escitalopram (LEXAPRO) 10 MG tablet; Take 2 tablets (20 mg total) by mouth daily.  Dispense: 30 tablet; Refill: 2 Continue- QUEtiapine (SEROQUEL) 100 MG tablet; Take 1 tablet (100 mg total) by mouth at bedtime.  Dispense: 30 tablet; Refill: 2  2. GAD (generalized anxiety disorder)  Continue- hydrOXYzine (ATARAX/VISTARIL) 25 MG tablet; Take 1 tablet (25 mg total) by mouth every 6 (six) hours as needed for anxiety.  Dispense: 90 tablet; Refill: 2 Continue- gabapentin (NEURONTIN) 400 MG capsule; Take 1 capsule (400 mg total) by mouth daily.  Dispense: 30 capsule; Refill: 2  3. Attention deficit hyperactivity disorder (ADHD), predominantly inattentive type  Continue- atomoxetine (STRATTERA) 80 MG capsule; Take 1 capsule (80 mg total) by mouth daily.  Dispense: 30 capsule; Refill: 2    Follow-up in 3 months Follow-up with therapy   Shanna Cisco, NP 08/23/2020, 12:15 PM

## 2020-09-16 ENCOUNTER — Other Ambulatory Visit (HOSPITAL_COMMUNITY): Payer: Self-pay | Admitting: Psychiatry

## 2020-09-16 DIAGNOSIS — F331 Major depressive disorder, recurrent, moderate: Secondary | ICD-10-CM

## 2020-09-26 ENCOUNTER — Telehealth (HOSPITAL_COMMUNITY): Payer: Self-pay | Admitting: Psychiatry

## 2020-09-26 ENCOUNTER — Other Ambulatory Visit (HOSPITAL_COMMUNITY): Payer: Self-pay | Admitting: Psychiatry

## 2020-09-26 DIAGNOSIS — F331 Major depressive disorder, recurrent, moderate: Secondary | ICD-10-CM

## 2020-09-26 DIAGNOSIS — F411 Generalized anxiety disorder: Secondary | ICD-10-CM

## 2020-09-26 DIAGNOSIS — F900 Attention-deficit hyperactivity disorder, predominantly inattentive type: Secondary | ICD-10-CM

## 2020-09-26 MED ORDER — ATOMOXETINE HCL 80 MG PO CAPS: 80.0000 mg | ORAL_CAPSULE | Freq: Every day | ORAL | 2 refills | Status: DC

## 2020-09-26 MED ORDER — HYDROXYZINE PAMOATE 25 MG PO CAPS: 25.0000 mg | ORAL_CAPSULE | Freq: Three times a day (TID) | ORAL | 2 refills | Status: DC

## 2020-09-26 MED ORDER — GABAPENTIN 400 MG PO CAPS: 400.0000 mg | ORAL_CAPSULE | Freq: Every day | ORAL | 2 refills | Status: DC

## 2020-09-26 MED ORDER — QUETIAPINE FUMARATE 100 MG PO TABS: ORAL_TABLET | ORAL | 1 refills | Status: DC

## 2020-09-26 MED ORDER — ESCITALOPRAM OXALATE 10 MG PO TABS: 20.0000 mg | ORAL_TABLET | Freq: Every day | ORAL | 2 refills | Status: DC

## 2020-09-26 MED ORDER — TRAZODONE HCL 50 MG PO TABS: 50.0000 mg | ORAL_TABLET | Freq: Every evening | ORAL | 1 refills | Status: DC | PRN

## 2020-09-26 NOTE — Telephone Encounter (Signed)
Medications filled and sent to preferred pharmacy.

## 2020-10-01 ENCOUNTER — Telehealth (HOSPITAL_COMMUNITY): Payer: Self-pay | Admitting: *Deleted

## 2020-10-01 NOTE — Telephone Encounter (Signed)
Call from patient stating she is out of most of her medicine and just out of [facility] and they prescribed her Wellbutrin but now that she is not under their care they cant write for it but she is nearly out of that too. She does not have an appt with Brittney until 1/21. Her Wellbutrin RX is for HCL  150 mg 1 Q AM. Reviewed record and she should have meds for all of her other medicine written on 09/26/20.

## 2020-10-02 ENCOUNTER — Other Ambulatory Visit (HOSPITAL_COMMUNITY): Payer: Self-pay | Admitting: Psychiatry

## 2020-10-03 ENCOUNTER — Other Ambulatory Visit (HOSPITAL_COMMUNITY): Payer: Self-pay | Admitting: Psychiatry

## 2020-10-03 DIAGNOSIS — F900 Attention-deficit hyperactivity disorder, predominantly inattentive type: Secondary | ICD-10-CM

## 2020-10-03 DIAGNOSIS — F331 Major depressive disorder, recurrent, moderate: Secondary | ICD-10-CM

## 2020-10-03 MED ORDER — BUPROPION HCL ER (XL) 150 MG PO TB24: 150.0000 mg | ORAL_TABLET | ORAL | 2 refills | Status: DC

## 2020-10-03 NOTE — Telephone Encounter (Signed)
Wellbutrin filled and sent to preferred pharmacy.

## 2020-10-04 ENCOUNTER — Emergency Department (HOSPITAL_COMMUNITY)
Admission: EM | Admit: 2020-10-04 | Discharge: 2020-10-05 | Disposition: A | Discharge status: Home / Self Care | Payer: Self-pay | Attending: Emergency Medicine | Admitting: Emergency Medicine

## 2020-10-04 ENCOUNTER — Other Ambulatory Visit: Payer: Self-pay

## 2020-10-04 ENCOUNTER — Encounter (HOSPITAL_COMMUNITY): Payer: Self-pay | Admitting: Emergency Medicine

## 2020-10-04 DIAGNOSIS — F3342 Major depressive disorder, recurrent, in full remission: Secondary | ICD-10-CM | POA: Diagnosis present

## 2020-10-04 DIAGNOSIS — F411 Generalized anxiety disorder: Secondary | ICD-10-CM | POA: Diagnosis present

## 2020-10-04 DIAGNOSIS — R45851 Suicidal ideations: Secondary | ICD-10-CM | POA: Insufficient documentation

## 2020-10-04 DIAGNOSIS — F909 Attention-deficit hyperactivity disorder, unspecified type: Secondary | ICD-10-CM | POA: Insufficient documentation

## 2020-10-04 DIAGNOSIS — Z20822 Contact with and (suspected) exposure to covid-19: Secondary | ICD-10-CM | POA: Insufficient documentation

## 2020-10-04 DIAGNOSIS — F1024 Alcohol dependence with alcohol-induced mood disorder: Secondary | ICD-10-CM | POA: Insufficient documentation

## 2020-10-04 DIAGNOSIS — F1020 Alcohol dependence, uncomplicated: Secondary | ICD-10-CM | POA: Diagnosis present

## 2020-10-04 DIAGNOSIS — F331 Major depressive disorder, recurrent, moderate: Secondary | ICD-10-CM | POA: Diagnosis present

## 2020-10-04 DIAGNOSIS — F419 Anxiety disorder, unspecified: Secondary | ICD-10-CM | POA: Insufficient documentation

## 2020-10-04 LAB — COMPREHENSIVE METABOLIC PANEL
ALT: 24 U/L (ref 0–44)
AST: 25 U/L (ref 15–41)
Albumin: 3.9 g/dL (ref 3.5–5.0)
Alkaline Phosphatase: 60 U/L (ref 38–126)
Anion gap: 10 (ref 5–15)
BUN: 6 mg/dL (ref 6–20)
CO2: 26 mmol/L (ref 22–32)
Calcium: 9.3 mg/dL (ref 8.9–10.3)
Chloride: 106 mmol/L (ref 98–111)
Creatinine, Ser: 0.85 mg/dL (ref 0.44–1.00)
GFR, Estimated: >60 mL/min (ref >60)
Glucose, Bld: 117 mg/dL — ABNORMAL HIGH (ref 70–99)
Potassium: 3.5 mmol/L (ref 3.5–5.1)
Sodium: 142 mmol/L (ref 135–145)
Total Bilirubin: 0.2 mg/dL — ABNORMAL LOW (ref 0.3–1.2)
Total Protein: 6.5 g/dL (ref 6.5–8.1)

## 2020-10-04 LAB — CBC
HCT: 36.5 % (ref 36.0–46.0)
Hemoglobin: 11.9 g/dL — ABNORMAL LOW (ref 12.0–15.0)
MCH: 28.2 pg (ref 26.0–34.0)
MCHC: 32.6 g/dL (ref 30.0–36.0)
MCV: 86.5 fL (ref 80.0–100.0)
Platelets: 348 10*3/uL (ref 150–400)
RBC: 4.22 MIL/uL (ref 3.87–5.11)
RDW: 14 % (ref 11.5–15.5)
WBC: 11.4 10*3/uL — ABNORMAL HIGH (ref 4.0–10.5)
nRBC: 0 % (ref 0.0–0.2)

## 2020-10-04 LAB — I-STAT BETA HCG BLOOD, ED (MC, WL, AP ONLY): I-stat hCG, quantitative: <5 m[IU]/mL (ref <5)

## 2020-10-04 LAB — SALICYLATE LEVEL: Salicylate Lvl: <7 mg/dL — ABNORMAL LOW (ref 7.0–30.0)

## 2020-10-04 LAB — ETHANOL: Alcohol, Ethyl (B): 200 mg/dL — ABNORMAL HIGH (ref <10)

## 2020-10-04 LAB — RAPID URINE DRUG SCREEN, HOSP PERFORMED
Amphetamines: NOT DETECTED
Barbiturates: NOT DETECTED
Benzodiazepines: NOT DETECTED
Cocaine: NOT DETECTED
Opiates: NOT DETECTED
Tetrahydrocannabinol: NOT DETECTED

## 2020-10-04 LAB — ACETAMINOPHEN LEVEL: Acetaminophen (Tylenol), Serum: <10 ug/mL — ABNORMAL LOW (ref 10–30)

## 2020-10-04 MED ORDER — LORAZEPAM 2 MG/ML IJ SOLN: 0.0000 mg | Freq: Two times a day (BID) | INTRAMUSCULAR | Status: DC

## 2020-10-04 MED ORDER — THIAMINE HCL 100 MG PO TABS
100.0000 mg | ORAL_TABLET | Freq: Every day | ORAL | Status: DC
  Administered 2020-10-04 – 2020-10-05 (×2): 100 mg via ORAL
  Filled 2020-10-04 (×2): qty 1

## 2020-10-04 MED ORDER — ACETAMINOPHEN 325 MG PO TABS: 650.0000 mg | ORAL_TABLET | ORAL | Status: DC | PRN

## 2020-10-04 MED ORDER — QUETIAPINE FUMARATE 50 MG PO TABS
100.0000 mg | ORAL_TABLET | Freq: Every day | ORAL | Status: DC
  Filled 2020-10-04 (×2): qty 1

## 2020-10-04 MED ORDER — LORAZEPAM 1 MG PO TABS: 0.0000 mg | ORAL_TABLET | Freq: Two times a day (BID) | ORAL | Status: DC

## 2020-10-04 MED ORDER — NICOTINE 7 MG/24HR TD PT24
7.0000 mg | MEDICATED_PATCH | Freq: Every day | TRANSDERMAL | Status: DC
  Administered 2020-10-04 – 2020-10-05 (×2): 7 mg via TRANSDERMAL
  Filled 2020-10-04 (×3): qty 1

## 2020-10-04 MED ORDER — THIAMINE HCL 100 MG/ML IJ SOLN
100.0000 mg | Freq: Every day | INTRAMUSCULAR | Status: DC
  Filled 2020-10-04: qty 2

## 2020-10-04 MED ORDER — GABAPENTIN 400 MG PO CAPS
400.0000 mg | ORAL_CAPSULE | Freq: Every day | ORAL | Status: DC
  Administered 2020-10-04 – 2020-10-05 (×2): 400 mg via ORAL
  Filled 2020-10-04 (×2): qty 1

## 2020-10-04 MED ORDER — HYDROXYZINE HCL 25 MG PO TABS
25.0000 mg | ORAL_TABLET | Freq: Three times a day (TID) | ORAL | Status: DC
  Administered 2020-10-04 – 2020-10-05 (×2): 25 mg via ORAL
  Filled 2020-10-04 (×2): qty 1

## 2020-10-04 MED ORDER — LORAZEPAM 1 MG PO TABS
0.0000 mg | ORAL_TABLET | Freq: Four times a day (QID) | ORAL | Status: DC
  Administered 2020-10-04: 1 mg via ORAL
  Filled 2020-10-04: qty 1

## 2020-10-04 MED ORDER — BUPROPION HCL ER (XL) 150 MG PO TB24
150.0000 mg | ORAL_TABLET | ORAL | Status: DC
  Administered 2020-10-05: 150 mg via ORAL
  Filled 2020-10-04: qty 1

## 2020-10-04 MED ORDER — ONDANSETRON HCL 4 MG PO TABS: 4.0000 mg | ORAL_TABLET | Freq: Three times a day (TID) | ORAL | Status: DC | PRN

## 2020-10-04 MED ORDER — LORAZEPAM 2 MG/ML IJ SOLN: 0.0000 mg | Freq: Four times a day (QID) | INTRAMUSCULAR | Status: DC

## 2020-10-04 MED ORDER — ESCITALOPRAM OXALATE 10 MG PO TABS
20.0000 mg | ORAL_TABLET | Freq: Every day | ORAL | Status: DC
  Administered 2020-10-04 – 2020-10-05 (×2): 20 mg via ORAL
  Filled 2020-10-04 (×2): qty 2

## 2020-10-04 MED ORDER — TRAZODONE HCL 50 MG PO TABS: 50.0000 mg | ORAL_TABLET | Freq: Every evening | ORAL | Status: DC | PRN

## 2020-10-04 MED ORDER — ATOMOXETINE HCL 40 MG PO CAPS
80.0000 mg | ORAL_CAPSULE | Freq: Every day | ORAL | Status: DC
  Administered 2020-10-05: 80 mg via ORAL
  Filled 2020-10-04 (×2): qty 2

## 2020-10-04 NOTE — ED Provider Notes (Signed)
[facility] Provider Note   CSN: [id number] Arrival date & time: 10/04/20  1750     History Chief Complaint  Patient presents with  . IVC  . Suicidal    Autumn MessingGretchen Vise is a 29 y.o. female with anxiety, depression, and ADHD who presents with an IVC after making suicidal threats to her parents while intoxicated.  Patient states that she has a history of alcohol abuse and has been in rehab and has not had anything to drink in about 30 days.  However, she had a recent break-up with started drinking again yesterday.  She states her most recent drink was this afternoon.  She states she got in an argument with her parents and thinks that she might have made suicidal threats.  She endorses continued SI without a specific plan.  At this time, she does not have any HI or AVH but states that she is still upset with her parents.  Denies other drug use.  The history is provided by the patient and the police.  Mental Health Problem Presenting symptoms: suicidal thoughts and suicidal threats   Presenting symptoms: no hallucinations, no homicidal ideas and no self-mutilation   Patient accompanied by:  Law enforcement Degree of incapacity (severity):  Moderate Onset quality:  Gradual Duration:  2 days Timing:  Constant Progression:  Waxing and waning Chronicity:  Recurrent Context: alcohol use   Treatment compliance:  All of the time Relieved by:  Nothing Worsened by:  Alcohol Associated symptoms: poor judgment   Associated symptoms: no abdominal pain and no chest pain   Risk factors: hx of mental illness and recent psychiatric admission        Past Medical History:  Diagnosis Date  . Kidney stone   . Polycystic disease, ovaries     Patient Active Problem List   Diagnosis Date Noted  . Attention deficit hyperactivity disorder (ADHD), predominantly inattentive type 08/23/2020  . Moderate episode of recurrent major depressive disorder (HCC)  06/25/2020  . Dysmenorrhea 06/21/2020  . ADHD (attention deficit hyperactivity disorder) 06/21/2020  . GAD (generalized anxiety disorder) 04/27/2020  . Alcohol use disorder, moderate, dependence (HCC) 04/27/2020  . Recurrent major depressive disorder, in full remission (HCC) 06/21/2019  . Seborrheic dermatitis, unspecified 10/06/2011    Past Surgical History:  Procedure Laterality Date  . TONSILLECTOMY       OB History   No obstetric history on file.     Family History  Problem Relation Age of Onset  . Hypertension Mother   . Healthy Father     Social History   Tobacco Use  . Smoking status: Never Smoker  . Smokeless tobacco: Never Used  Vaping Use  . Vaping Use: Some days  . Substances: THC, CBD  . Devices: Jule  Substance Use Topics  . Alcohol use: Yes    Comment: weekly  . Drug use: No    Home Medications Prior to Admission medications   Medication Sig Start Date End Date Taking? Authorizing Provider  atomoxetine (STRATTERA) 80 MG capsule Take 1 capsule (80 mg total) by mouth daily. 09/26/20   Shanna CiscoParsons, Brittney E, NP  buPROPion (WELLBUTRIN XL) 150 MG 24 hr tablet Take 1 tablet (150 mg total) by mouth every morning. 10/03/20 10/03/21  Shanna CiscoParsons, Brittney E, NP  escitalopram (LEXAPRO) 10 MG tablet Take 2 tablets (20 mg total) by mouth daily. 09/26/20   Shanna CiscoParsons, Brittney E, NP  gabapentin (NEURONTIN) 400 MG capsule Take 1 capsule (400 mg total) by mouth  daily. 09/26/20   Shanna Cisco, NP  hydrOXYzine (VISTARIL) 25 MG capsule Take 1 capsule (25 mg total) by mouth 3 (three) times daily. 09/26/20   Shanna Cisco, NP  QUEtiapine (SEROQUEL) 100 MG tablet TAKE 1 TABLET BY MOUTH EVERYDAY AT BEDTIME 09/26/20   Toy Cookey E, NP  traZODone (DESYREL) 50 MG tablet Take 1 tablet (50 mg total) by mouth at bedtime as needed. for sleep 09/26/20   Shanna Cisco, NP    Allergies    Patient has no known allergies.  Review of Systems   Review of Systems   Constitutional: Negative for chills and fever.  HENT: Negative for ear pain and sore throat.   Eyes: Negative for pain and visual disturbance.  Respiratory: Negative for cough and shortness of breath.   Cardiovascular: Negative for chest pain and palpitations.  Gastrointestinal: Negative for abdominal pain and vomiting.  Genitourinary: Negative for dysuria and hematuria.  Musculoskeletal: Negative for arthralgias and back pain.  Skin: Negative for color change and rash.  Neurological: Negative for seizures and syncope.  Psychiatric/Behavioral: Positive for behavioral problems and suicidal ideas. Negative for hallucinations, homicidal ideas and self-injury.  All other systems reviewed and are negative.   Physical Exam Updated Vital Signs BP 120/82 (BP Location: Right Arm)   Pulse 97   Temp 98.5 F (36.9 C) (Oral)   Resp 16   Ht 5\' 6"  (1.676 m)   Wt 77.1 kg   SpO2 100%   BMI 27.44 kg/m   Physical Exam Vitals and nursing note reviewed.  Constitutional:      General: She is not in acute distress.    Appearance: She is well-developed. She is not ill-appearing.  HENT:     Head: Normocephalic and atraumatic.     Right Ear: External ear normal.     Left Ear: External ear normal.     Nose: Nose normal. No rhinorrhea.  Eyes:     General: No scleral icterus.    Conjunctiva/sclera: Conjunctivae normal.  Cardiovascular:     Rate and Rhythm: Normal rate and regular rhythm.     Heart sounds: No murmur heard.   Pulmonary:     Effort: Pulmonary effort is normal. No respiratory distress.     Breath sounds: Normal breath sounds. No wheezing, rhonchi or rales.  Abdominal:     Palpations: Abdomen is soft.     Tenderness: There is no abdominal tenderness.  Musculoskeletal:        General: No swelling, tenderness, deformity or signs of injury.     Cervical back: Normal range of motion and neck supple.  Skin:    General: Skin is warm and dry.  Neurological:     Mental Status: She is  alert and oriented to person, place, and time. Mental status is at baseline.     Comments: Clinically sober on exam  Psychiatric:        Attention and Perception: Attention and perception normal.        Mood and Affect: Mood and affect normal.        Speech: Speech normal.        Behavior: Behavior normal. Behavior is cooperative.        Thought Content: Thought content includes suicidal ideation. Thought content does not include homicidal ideation. Thought content does not include homicidal or suicidal plan.        Cognition and Memory: Cognition and memory normal.        Judgment: Judgment is impulsive  and inappropriate.     ED Results / Procedures / Treatments   Labs (all labs ordered are listed, but only abnormal results are displayed) Labs Reviewed  COMPREHENSIVE METABOLIC PANEL - Abnormal; Notable for the following components:      Result Value   Glucose, Bld 117 (*)    Total Bilirubin 0.2 (*)    All other components within normal limits  ETHANOL - Abnormal; Notable for the following components:   Alcohol, Ethyl (B) 200 (*)    All other components within normal limits  SALICYLATE LEVEL - Abnormal; Notable for the following components:   Salicylate Lvl <7.0 (*)    All other components within normal limits  ACETAMINOPHEN LEVEL - Abnormal; Notable for the following components:   Acetaminophen (Tylenol), Serum <10 (*)    All other components within normal limits  CBC - Abnormal; Notable for the following components:   WBC 11.4 (*)    Hemoglobin 11.9 (*)    All other components within normal limits  RESP PANEL BY RT-PCR (FLU A&B, COVID) ARPGX2  RAPID URINE DRUG SCREEN, HOSP PERFORMED  I-STAT BETA HCG BLOOD, ED ([facility], [facility], [facility] ONLY)    EKG None  Radiology No results found.  Procedures Procedures (including critical care time)  Medications Ordered in ED Medications  hydrOXYzine (ATARAX/VISTARIL) tablet 25 mg (25 mg Oral Given 10/04/20 2314)  QUEtiapine (SEROQUEL) tablet  100 mg (has no administration in time range)  traZODone (DESYREL) tablet 50 mg (has no administration in time range)  gabapentin (NEURONTIN) capsule 400 mg (400 mg Oral Given 10/04/20 2314)  escitalopram (LEXAPRO) tablet 20 mg (has no administration in time range)  buPROPion (WELLBUTRIN XL) 24 hr tablet 150 mg (has no administration in time range)  atomoxetine (STRATTERA) capsule 80 mg (has no administration in time range)  LORazepam (ATIVAN) injection 0-4 mg ( Intravenous See Alternative 10/04/20 2325)    Or  LORazepam (ATIVAN) tablet 0-4 mg (1 mg Oral Given 10/04/20 2325)  LORazepam (ATIVAN) injection 0-4 mg (has no administration in time range)    Or  LORazepam (ATIVAN) tablet 0-4 mg (has no administration in time range)  thiamine tablet 100 mg (100 mg Oral Given 10/04/20 2315)    Or  thiamine (B-1) injection 100 mg ( Intravenous See Alternative 10/04/20 2315)  nicotine (NICODERM CQ - dosed in mg/24 hr) patch 7 mg (7 mg Transdermal Patch Applied 10/04/20 2315)  ondansetron (ZOFRAN) tablet 4 mg (has no administration in time range)  acetaminophen (TYLENOL) tablet 650 mg (has no administration in time range)    ED Course  I have reviewed the triage vital signs and the nursing notes.  Pertinent labs & imaging results that were available during my care of the patient were reviewed by me and considered in my medical decision making (see chart for details).  Clinical Course as of Oct 06 3  Thu Oct 04, 2020  2358 Alcohol, Ethyl (B)(!): 200 [CH]    Clinical Course User Index [CH] Gershon Mussel, MD   MDM Rules/Calculators/A&P                          MDM: Aashritha Miedema is a 29 y.o. female who presents with SI as per above. I have reviewed the nursing documentation for past medical history, family history, and social history. Pertinent previous records reviewed. She is awake, alert. HDS. Afebrile. Physical exam is most notable for SI.  Labs: WBC 11.4, alcohol 200.  Tylenol level,  salicylate level, CMP, and UDS unremarkable.  hCG negative. EKG: Pending. Consults: TTS/psych team Tx: Home medications ordered.  Placed on CIWA protocol.  Differential Dx: I am most concerned for SI and alcohol abuse. Given history, physical exam, and work-up, I do not think she has psychosis, mania, recreational drug intoxication, stroke, metabolic derangement, or trauma.  MDM: Aliani Caccavale is a 29 y.o. female presents with IVC in place for suicidal threats.  She continues to endorse passive SI without a plan.  Patient is clinically sober on assessment.  Given IVC and continued SI with concern for underlying alcohol abuse, will consult TTS.  Patient placed on CIWA protocol.  Home meds ordered. Sitter at bedside. Patient denies concerns at this time other than anxiety.  The plan for this patient was discussed with Dr. Lockie Mola, who voiced agreement and who oversaw evaluation and treatment of this patient.   Final Clinical Impression(s) / ED Diagnoses Final diagnoses:  None    Rx / DC Orders ED Discharge Orders    None       Dovie Kapusta, MD 10/05/20 0004    Virgina Norfolk, DO 10/05/20 0254

## 2020-10-04 NOTE — ED Triage Notes (Signed)
Patient arrives to ED under IVC with [employer]. Per [employer] pt is an alcoholic and was attempting to hurt herself today. Pt was IVC by mother. Pt is currently intoxicated. Pts plan was to drive a car into a brick wall.

## 2020-10-05 LAB — RESP PANEL BY RT-PCR (FLU A&B, COVID) ARPGX2
Influenza A by PCR: NEGATIVE
Influenza B by PCR: NEGATIVE
SARS Coronavirus 2 by RT PCR: NEGATIVE

## 2020-10-05 NOTE — ED Notes (Signed)
Breakfast Ordered 

## 2020-10-05 NOTE — ED Provider Notes (Signed)
Patient has been seen and evaluated by our behavioral health colleagues, has been designated safe for discharge, IVC papers have been rescinded.   Gerhard Munch, MD 10/05/20 1410

## 2020-10-05 NOTE — Progress Notes (Signed)
CSW added several resources guides to the patient's AVS including mental health and substance abuse resources.  Edwin Dada, MSW, LCSW-A Transitions of Care  Clinical Social Worker I [facility] Emergency Departments  Medical ICU [phone]

## 2020-10-05 NOTE — ED Notes (Signed)
tts said revaluate patient in the morning.

## 2020-10-05 NOTE — Consult Note (Signed)
Telepsych Consultation   Reason for Consult:  SI; psych consult Referring Physician:  Virgina Norfolk, DO Location of Patient: [facility] [phone] Location of Provider: [facility]  Patient Identification: Olivia Le MRN:  [id number] Principal Diagnosis: Alcohol use disorder, moderate, dependence (HCC) Diagnosis:  Principal Problem:   Alcohol use disorder, moderate, dependence (HCC) Active Problems:   GAD (generalized anxiety disorder)   Recurrent major depressive disorder, in full remission (HCC)   Moderate episode of recurrent major depressive disorder (HCC)  Total Time spent with patient: 20 minutes  Subjective:   Olivia Le is a 29 y.o. female patient admitted via IVC for making suicidal statements while intoxicated. Patient acknowledges drinking on day prior, not clear on everything said or done while intoxicated.   "Major depressive and alcohol induced episodes. The same reason I was here in April, I get depressed and drink and get irrational and start acting out. The expectation of what I want to be and where I currently am. I'm dealing with a break-up and alcohol. I went to [facility] in August and stayed for a month (28 days); I couldn't take the extra eight weeks because I had to work. I would love long-term that I can pay to go to. I know I need to stop drinking. 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 I'm on meds, I need something and would like to be on something else. Moreso for anxiety; the Wellbutrin added helped".   Patient sees [facility]) for medication management and will begin counseling with an addiction specialist for weekly sessions this week Olivia Le). Interested in possible IOP; states if they have alcohol and drug test "that would really help with accountability". States she attends AA and has a sponsor, "Everything is going great. I just need to learn to cope. I recently moved  back in with my parents so I could sell my townhouse to keep me from isolating. They're a great support"  On assessment patient presents calm and cooperative; fully engaged and answering all questions appropriately. Patient is currently denying any suicidal/homicidal ideations, auditory/visual hallucinations, and does not appear to be responding to any external/internal stimuli.   Collateral: Olivia Le [phone] pt's mom 12:55pm "As you can see she's a wonderful person until she starts drinking. I agree we need to have something in place for her long-term treatment-wise. I've been looking into things. Someone told me a place communal living places [facility]" and we're looking for some other longer-term treatments. Keeping her clearly won't do her any good. Obviously she is not in any immediate danger so I'm fine with her being released".   Provider discussed with mom SW and Peer Support consults placed for outpatient follow-up. Mom agreed and states she is en route to assist patient with transportation home.   HPI:   Olivia Le is a 29 year old female with a past psychiatric history of anxiety, depression, ADHD, and alcohol abuse disorder. Patient was admitted via IVC after making suicidal threats to her parents while intoxicated. Patient was most recently in [facility] (August 2021) for 28 days where she states she left the program early "due to work". Patient has a Energy manager degree from [employer] and currently works as a Customer service manager. She recently relocated back to her parents home and states she has two older brothers. She is currently being seen by Clydene Short via [facility] for outpatient medication management and states she  has an appointment for therapy with addictions specialist Nettie Elm for outpatient therapy. Current medication includes Wellbutrin XL 150mg  daily, LexaPro 20mg  daily, Strattera 80mg  daily, Hydroxyzine 25mg  TID, Seroquel 100mg  HS, and Trazodone  50mg  prn.   Past Psychiatric History:  Anxiety, Depression, ADHD, Alcohol Abuse Disorder  Risk to Self:  pt denies Risk to Others:  pt denies Prior Inpatient Therapy:  pt denies Prior Outpatient Therapy:  pt denies  Past Medical History:  Past Medical History:  Diagnosis Date  . Kidney stone   . Polycystic disease, ovaries     Past Surgical History:  Procedure Laterality Date  . TONSILLECTOMY     Family History:  Family History  Problem Relation Age of Onset  . Hypertension Mother   . Healthy Father    Family Psychiatric  History: depression w/ (m) uncle; substance abuse in cousins  Social History:  Social History   Substance and Sexual Activity  Alcohol Use Yes   Comment: weekly     Social History   Substance and Sexual Activity  Drug Use No    Social History   Socioeconomic History  . Marital status: Single    Spouse name: Not on file  . Number of children: Not on file  . Years of education: Not on file  . Highest education level: Not on file  Occupational History  . Not on file  Tobacco Use  . Smoking status: Never Smoker  . Smokeless tobacco: Never Used  Vaping Use  . Vaping Use: Some days  . Substances: THC, CBD  . Devices: Jule  Substance and Sexual Activity  . Alcohol use: Yes    Comment: weekly  . Drug use: No  . Sexual activity: Not on file  Other Topics Concern  . Not on file  Social History Narrative  . Not on file   Social Determinants of Health   Financial Resource Strain:   . Difficulty of Paying Living Expenses: Not on file  Food Insecurity:   . Worried About in the Last Year: Not on file  . Ran Out of Food in the Last Year: Not on file  Transportation Needs:   . Lack of Transportation (Medical): Not on file  . Lack of Transportation (Non-Medical): Not on file  Physical Activity:   . Days of Exercise per Week: Not on file  . Minutes of Exercise per Session: Not on file  Stress:   . Feeling of Stress :  Not on file  Social Connections:   . Frequency of Communication with Friends and Family: Not on file  . Frequency of Social Gatherings with Friends and Family: Not on file  . Attends Religious Services: Not on file  . Active Member of Clubs or Organizations: Not on file  . Attends Meetings: Not on file  . Marital Status: Not on file   Additional Social History:    Allergies:  No Known Allergies  Labs:  Results for orders placed or performed during the hospital encounter of 10/04/20 (from the past 48 hour(s))  Comprehensive metabolic panel     Status: Abnormal   Collection Time: 10/04/20  6:19 PM  Result Value Ref Range   Sodium 142 135 - 145 mmol/L   Potassium 3.5 3.5 - 5.1 mmol/L   Chloride 106 98 - 111 mmol/L   CO2 26 22 - 32 mmol/L   Glucose, Bld 117 (H) 70 - 99 mg/dL    Comment: Glucose reference range applies  only to samples taken after fasting for at least 8 hours.   BUN 6 6 - 20 mg/dL   Creatinine, Ser 9.60 0.44 - 1.00 mg/dL   Calcium 9.3 8.9 - 45.4 mg/dL   Total Protein 6.5 6.5 - 8.1 g/dL   Albumin 3.9 3.5 - 5.0 g/dL   AST 25 15 - 41 U/L   ALT 24 0 - 44 U/L   Alkaline Phosphatase 60 38 - 126 U/L   Total Bilirubin 0.2 (L) 0.3 - 1.2 mg/dL   GFR, Estimated >09 >81 mL/min    Comment: (NOTE) Calculated using the CKD-EPI Creatinine Equation (2021)    Anion gap 10 5 - 15    Comment: Performed at [facility], [address], [place], [place] [address]  Ethanol     Status: Abnormal   Collection Time: 10/04/20  6:19 PM  Result Value Ref Range   Alcohol, Ethyl (B) 200 (H) <10 mg/dL    Comment: (NOTE) Lowest detectable limit for serum alcohol is 10 mg/dL.  For medical purposes only. Performed at [facility], [address] [place], [place] [address]   Salicylate level     Status: Abnormal   Collection Time: 10/04/20  6:19 PM  Result Value Ref Range   Salicylate Lvl <7.0 (L) 7.0 - 30.0 mg/dL    Comment: Performed at [facility], [address], [place], [place] [address]  Acetaminophen level     Status: Abnormal   Collection Time: 10/04/20  6:19 PM  Result Value Ref Range   Acetaminophen (Tylenol), Serum <10 (L) 10 - 30 ug/mL    Comment: (NOTE) Therapeutic concentrations vary significantly. A range of 10-30 ug/mL  may be an effective concentration for many patients. However, some  are best treated at concentrations outside of this range. Acetaminophen concentrations >150 ug/mL at 4 hours after ingestion  and >50 ug/mL at 12 hours after ingestion are often associated with  toxic reactions.  Performed at [facility], [address] [place], [place] [address]   cbc     Status: Abnormal   Collection Time: 10/04/20  6:19 PM  Result Value Ref Range   WBC 11.4 (H) 4.0 - 10.5 K/uL   RBC 4.22 3.87 - 5.11 MIL/uL   Hemoglobin 11.9 (L) 12.0 - 15.0 g/dL   HCT 69.6 36 - 46 %   MCV 86.5 80.0 - 100.0 fL   MCH 28.2 26.0 - 34.0 pg   MCHC 32.6 30.0 - 36.0 g/dL   RDW 29.5 28.4 - 13.2 %   Platelets 348 150 - 400 K/uL   nRBC 0.0 0.0 - 0.2 %    Comment: Performed at [facility], [address], [place], [place] [address]  Rapid urine drug screen (hospital performed)     Status: None   Collection Time: 10/04/20  6:24 PM  Result Value Ref Range   Opiates NONE DETECTED NONE DETECTED   Cocaine NONE DETECTED NONE DETECTED   Benzodiazepines NONE DETECTED NONE DETECTED   Amphetamines NONE DETECTED NONE DETECTED   Tetrahydrocannabinol NONE DETECTED NONE DETECTED   Barbiturates NONE DETECTED NONE DETECTED    Comment: (NOTE) DRUG SCREEN FOR MEDICAL PURPOSES ONLY.  IF CONFIRMATION IS NEEDED FOR ANY PURPOSE, NOTIFY LAB WITHIN 5 DAYS.  LOWEST DETECTABLE LIMITS FOR URINE DRUG SCREEN Drug Class                     Cutoff (ng/mL) Amphetamine and  metabolites    1000 Barbiturate and metabolites    200 Benzodiazepine                 200 Tricyclics and metabolites     300 Opiates and metabolites         300 Cocaine and metabolites        300 THC                            50 Performed at [facility], [address], [place], [place] [address]   I-Stat beta hCG blood, ED     Status: None   Collection Time: 10/04/20  6:36 PM  Result Value Ref Range   I-stat hCG, quantitative <5.0 <5 mIU/mL   Comment 3            Comment:   GEST. AGE      CONC.  (mIU/mL)   <=1 WEEK        5 - 50     2 WEEKS       50 - 500     3 WEEKS       100 - 10,000     4 WEEKS     1,000 - 30,000        FEMALE AND NON-PREGNANT FEMALE:     LESS THAN 5 mIU/mL     Medications:  Current Facility-Administered Medications  Medication Dose Route Frequency Provider Last Rate Last Admin  . acetaminophen (TYLENOL) tablet 650 mg  650 mg Oral Q4H PRN Hendley, Cornelia, MD      . atomoxetine (STRATTERA) capsule 80 mg  80 mg Oral Daily Hendley, Cornelia, MD   80 mg at 10/05/20 1108  . buPROPion (WELLBUTRIN XL) 24 hr tablet 150 mg  150 mg Oral Carole Binning, Cornelia, MD   150 mg at 10/05/20 0923  . escitalopram (LEXAPRO) tablet 20 mg  20 mg Oral Daily Hendley, Cornelia, MD   20 mg at 10/05/20 0923  . gabapentin (NEURONTIN) capsule 400 mg  400 mg Oral Daily Hendley, Cornelia, MD   400 mg at 10/05/20 7989  . hydrOXYzine (ATARAX/VISTARIL) tablet 25 mg  25 mg Oral TID Hendley, Cornelia, MD   25 mg at 10/05/20 0923  . LORazepam (ATIVAN) injection 0-4 mg  0-4 mg Intravenous Q6H Hendley, Cornelia, MD       Or  . LORazepam (ATIVAN) tablet 0-4 mg  0-4 mg Oral Q6H Hendley, Cornelia, MD   1 mg at 10/04/20 2325  . [START ON 10/07/2020] LORazepam (ATIVAN) injection 0-4 mg  0-4 mg Intravenous Q12H Hendley, Cornelia, MD       Or  . Melene Muller ON 10/07/2020] LORazepam (ATIVAN) tablet 0-4 mg  0-4 mg Oral Q12H Hendley, Cornelia, MD      . nicotine (NICODERM CQ - dosed in mg/24 hr) patch 7 mg  7 mg Transdermal Daily Hendley, Cornelia, MD   7 mg at 10/05/20 0925  . ondansetron (ZOFRAN) tablet 4 mg  4 mg Oral Q8H PRN Hendley, Cornelia, MD       . QUEtiapine (SEROQUEL) tablet 100 mg  100 mg Oral QHS Hendley, Cornelia, MD      . thiamine tablet 100 mg  100 mg Oral Daily Hendley, Cornelia, MD   100 mg at 10/05/20 2119   Or  . thiamine (B-1) injection 100 mg  100 mg Intravenous Daily Hendley, Cornelia, MD      . traZODone (DESYREL) tablet  50 mg  50 mg Oral QHS PRN Hendley, Cornelia, MD       Current Outpatient Medications  Medication Sig Dispense Refill  . atomoxetine (STRATTERA) 80 MG capsule Take 1 capsule (80 mg total) by mouth daily. 30 capsule 2  . escitalopram (LEXAPRO) 10 MG tablet Take 2 tablets (20 mg total) by mouth daily. 30 tablet 2  . gabapentin (NEURONTIN) 400 MG capsule Take 1 capsule (400 mg total) by mouth daily. (Patient taking differently: Take 400 mg by mouth daily as needed (nerve pain). ) 30 capsule 2  . hydrOXYzine (VISTARIL) 25 MG capsule Take 1 capsule (25 mg total) by mouth 3 (three) times daily. (Patient taking differently: Take 25 mg by mouth 3 (three) times daily as needed for anxiety. ) 90 capsule 2  . QUEtiapine (SEROQUEL) 100 MG tablet TAKE 1 TABLET BY MOUTH EVERYDAY AT BEDTIME (Patient taking differently: Take 100 mg by mouth at bedtime. ) 30 tablet 1  . buPROPion (WELLBUTRIN XL) 150 MG 24 hr tablet Take 1 tablet (150 mg total) by mouth every morning. 30 tablet 2  . traZODone (DESYREL) 50 MG tablet Take 1 tablet (50 mg total) by mouth at bedtime as needed. for sleep (Patient not taking: Reported on 10/05/2020) 30 tablet 1   Musculoskeletal: Strength & Muscle Tone: within normal limits Gait & Station: normal Patient leans: N/A  Psychiatric Specialty Exam: Physical Exam Vitals and nursing note reviewed.     Review of Systems  Psychiatric/Behavioral: Negative.   All other systems reviewed and are negative.   Blood pressure 117/64, pulse (!) 106, temperature 98.3 F (36.8 C), temperature source Oral, resp. rate 16, height  (1.676 m), weight 77.1 kg, SpO2 100 %.Body mass index is 27.44 kg/m.   General Appearance: Casual  Eye Contact:  Good  Speech:  Clear and Coherent  Volume:  Normal  Mood:  Euthymic  Affect:  Appropriate and Congruent  Thought Process:  Coherent and Linear  Orientation:  Full (Time, Place, and Person)  Thought Content:  WDL and Logical  Suicidal Thoughts:  No  Homicidal Thoughts:  No  Memory:  Immediate;   Good  Judgement:  Intact  Insight:  Present  Psychomotor Activity:  Normal  Concentration:  Concentration: Good and Attention Span: Good  Recall:  Fair  Fund of Knowledge:  Good  Language:  Good  Akathisia:  NA  Handed:  Right  AIMS (if indicated):     Assets:  Communication Skills Desire for Improvement Financial Resources/Insurance Housing Leisure Time Physical Health Resilience Social Support Talents/Skills Transportation Vocational/Educational  ADL's:  Intact  Cognition:  WNL  Sleep:      Treatment Plan Summary: Plan to discharge patient home with referrals and resources for outpatient services.   Disposition: No evidence of imminent risk to self or others at present.   Patient does not meet criteria for psychiatric inpatient admission. Supportive therapy provided about ongoing stressors. Refer to IOP. Discussed crisis plan, support from social network, calling 911, coming to the Emergency Department, and calling Suicide Hotline.  Patient is currently denying any suicidal/homicidal ideations, auditory/visual hallucinations, and does not appear to be responding to any external/internal stimuli at this time. Collateral obtained from patient's mother who states she feels safe with patient being discharge with plans to follow-up with outpatient services. SW and Peer Support consults placed.   This service was provided via telemedicine using a 2-way, interactive audio and video technology.  Names of all persons participating in this telemedicine service and  their role in this encounter. Name: Maxie BarbBrooke Leevy-Johnson Role: PMHNP  Name:  Ovidio Kinrchana Role: Attending MD  Name: Olivia Le Role: patient  Name: Roderic Scarcerina Goates Role: mother    Loletta ParishBrooke A Leevy-Johnson, NP 10/05/2020 1:17 PM
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
## 2020-10-05 NOTE — ED Notes (Signed)
IVC rescinded by MD Jeraldine Loots

## 2020-10-05 NOTE — ED Notes (Signed)
Pt made a phone call to mom at this time

## 2020-10-05 NOTE — BH Assessment (Signed)
Clinician spoke to [place], Charity fundraiser and will call TTS cart in 20 minutes.     Redmond Pulling, MS, [facility], [facility] Triage Specialist [phone]

## 2020-10-05 NOTE — ED Notes (Signed)
Patient Alert and oriented to baseline. Stable and ambulatory to baseline. Patient verbalized understanding of the discharge instructions.  Patient belongings were taken by the patient.   

## 2020-10-08 ENCOUNTER — Encounter (HOSPITAL_COMMUNITY): Payer: Self-pay

## 2020-10-08 ENCOUNTER — Emergency Department (HOSPITAL_COMMUNITY)
Admission: EM | Admit: 2020-10-08 | Discharge: 2020-10-09 | Disposition: A | Discharge status: Home / Self Care | Payer: Self-pay | Attending: Emergency Medicine | Admitting: Emergency Medicine

## 2020-10-08 DIAGNOSIS — R258 Other abnormal involuntary movements: Secondary | ICD-10-CM | POA: Insufficient documentation

## 2020-10-08 DIAGNOSIS — Z20822 Contact with and (suspected) exposure to covid-19: Secondary | ICD-10-CM | POA: Insufficient documentation

## 2020-10-08 DIAGNOSIS — F329 Major depressive disorder, single episode, unspecified: Secondary | ICD-10-CM | POA: Insufficient documentation

## 2020-10-08 DIAGNOSIS — Y907 Blood alcohol level of 200-239 mg/100 ml: Secondary | ICD-10-CM | POA: Insufficient documentation

## 2020-10-08 DIAGNOSIS — F1020 Alcohol dependence, uncomplicated: Secondary | ICD-10-CM

## 2020-10-08 DIAGNOSIS — F32A Depression, unspecified: Secondary | ICD-10-CM

## 2020-10-08 DIAGNOSIS — F10920 Alcohol use, unspecified with intoxication, uncomplicated: Secondary | ICD-10-CM

## 2020-10-08 DIAGNOSIS — Z046 Encounter for general psychiatric examination, requested by authority: Secondary | ICD-10-CM

## 2020-10-08 MED ORDER — IBUPROFEN 400 MG PO TABS
400.0000 mg | ORAL_TABLET | Freq: Once | ORAL | Status: AC | PRN
  Administered 2020-10-08: 400 mg via ORAL
  Filled 2020-10-08: qty 1

## 2020-10-08 NOTE — ED Notes (Signed)
Pt states that she will cooperate with staff if she receives something for headache, anxiety, and water.
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
## 2020-10-09 ENCOUNTER — Encounter (HOSPITAL_COMMUNITY): Payer: Self-pay | Admitting: Student

## 2020-10-09 ENCOUNTER — Other Ambulatory Visit: Payer: Self-pay

## 2020-10-09 ENCOUNTER — Ambulatory Visit (HOSPITAL_COMMUNITY): Payer: No Payment, Other | Admitting: Clinical

## 2020-10-09 DIAGNOSIS — F1020 Alcohol dependence, uncomplicated: Secondary | ICD-10-CM

## 2020-10-09 LAB — RAPID URINE DRUG SCREEN, HOSP PERFORMED
Amphetamines: NOT DETECTED
Barbiturates: NOT DETECTED
Benzodiazepines: NOT DETECTED
Cocaine: NOT DETECTED
Opiates: NOT DETECTED
Tetrahydrocannabinol: NOT DETECTED

## 2020-10-09 LAB — RESP PANEL BY RT-PCR (FLU A&B, COVID) ARPGX2
Influenza A by PCR: NEGATIVE
Influenza B by PCR: NEGATIVE
SARS Coronavirus 2 by RT PCR: NEGATIVE

## 2020-10-09 LAB — CBC
HCT: 42.1 % (ref 36.0–46.0)
Hemoglobin: 13.4 g/dL (ref 12.0–15.0)
MCH: 27.8 pg (ref 26.0–34.0)
MCHC: 31.8 g/dL (ref 30.0–36.0)
MCV: 87.3 fL (ref 80.0–100.0)
Platelets: 353 10*3/uL (ref 150–400)
RBC: 4.82 MIL/uL (ref 3.87–5.11)
RDW: 14.4 % (ref 11.5–15.5)
WBC: 11.8 10*3/uL — ABNORMAL HIGH (ref 4.0–10.5)
nRBC: 0.2 % (ref 0.0–0.2)

## 2020-10-09 LAB — ETHANOL: Alcohol, Ethyl (B): 222 mg/dL — ABNORMAL HIGH (ref <10)

## 2020-10-09 LAB — I-STAT BETA HCG BLOOD, ED (MC, WL, AP ONLY): I-stat hCG, quantitative: <5 m[IU]/mL (ref <5)

## 2020-10-09 LAB — COMPREHENSIVE METABOLIC PANEL
ALT: 21 U/L (ref 0–44)
AST: 17 U/L (ref 15–41)
Albumin: 4.4 g/dL (ref 3.5–5.0)
Alkaline Phosphatase: 76 U/L (ref 38–126)
Anion gap: 12 (ref 5–15)
BUN: 11 mg/dL (ref 6–20)
CO2: 27 mmol/L (ref 22–32)
Calcium: 10 mg/dL (ref 8.9–10.3)
Chloride: 103 mmol/L (ref 98–111)
Creatinine, Ser: 0.88 mg/dL (ref 0.44–1.00)
GFR, Estimated: >60 mL/min (ref >60)
Glucose, Bld: 82 mg/dL (ref 70–99)
Potassium: 4.4 mmol/L (ref 3.5–5.1)
Sodium: 142 mmol/L (ref 135–145)
Total Bilirubin: 0.5 mg/dL (ref 0.3–1.2)
Total Protein: 7.9 g/dL (ref 6.5–8.1)

## 2020-10-09 LAB — SALICYLATE LEVEL: Salicylate Lvl: <7 mg/dL — ABNORMAL LOW (ref 7.0–30.0)

## 2020-10-09 LAB — ACETAMINOPHEN LEVEL: Acetaminophen (Tylenol), Serum: <10 ug/mL — ABNORMAL LOW (ref 10–30)

## 2020-10-09 MED ORDER — LORAZEPAM 1 MG PO TABS: 0.0000 mg | ORAL_TABLET | Freq: Two times a day (BID) | ORAL | Status: DC

## 2020-10-09 MED ORDER — GABAPENTIN 400 MG PO CAPS: 400.0000 mg | ORAL_CAPSULE | Freq: Every day | ORAL | Status: DC | PRN

## 2020-10-09 MED ORDER — ALUM & MAG HYDROXIDE-SIMETH 200-200-20 MG/5ML PO SUSP: 30.0000 mL | Freq: Four times a day (QID) | ORAL | Status: DC | PRN

## 2020-10-09 MED ORDER — HYDROXYZINE HCL 25 MG PO TABS
25.0000 mg | ORAL_TABLET | Freq: Three times a day (TID) | ORAL | Status: DC | PRN
  Administered 2020-10-09: 25 mg via ORAL
  Filled 2020-10-09: qty 1

## 2020-10-09 MED ORDER — LORAZEPAM 2 MG/ML IJ SOLN: 0.0000 mg | Freq: Four times a day (QID) | INTRAMUSCULAR | Status: DC

## 2020-10-09 MED ORDER — ATOMOXETINE HCL 40 MG PO CAPS
80.0000 mg | ORAL_CAPSULE | Freq: Every day | ORAL | Status: DC
  Administered 2020-10-09: 80 mg via ORAL
  Filled 2020-10-09: qty 2

## 2020-10-09 MED ORDER — IBUPROFEN 400 MG PO TABS: 400.0000 mg | ORAL_TABLET | Freq: Three times a day (TID) | ORAL | Status: DC | PRN

## 2020-10-09 MED ORDER — LORAZEPAM 2 MG/ML IJ SOLN: 0.0000 mg | Freq: Two times a day (BID) | INTRAMUSCULAR | Status: DC

## 2020-10-09 MED ORDER — ESCITALOPRAM OXALATE 10 MG PO TABS
20.0000 mg | ORAL_TABLET | Freq: Every day | ORAL | Status: DC
  Administered 2020-10-09: 20 mg via ORAL
  Filled 2020-10-09: qty 2

## 2020-10-09 MED ORDER — THIAMINE HCL 100 MG/ML IJ SOLN: 100.0000 mg | Freq: Every day | INTRAMUSCULAR | Status: DC

## 2020-10-09 MED ORDER — THIAMINE HCL 100 MG PO TABS
100.0000 mg | ORAL_TABLET | Freq: Every day | ORAL | Status: DC
  Administered 2020-10-09: 100 mg via ORAL
  Filled 2020-10-09: qty 1

## 2020-10-09 MED ORDER — ONDANSETRON HCL 4 MG PO TABS: 4.0000 mg | ORAL_TABLET | Freq: Three times a day (TID) | ORAL | Status: DC | PRN

## 2020-10-09 MED ORDER — LORAZEPAM 1 MG PO TABS
0.0000 mg | ORAL_TABLET | Freq: Four times a day (QID) | ORAL | Status: DC
  Administered 2020-10-09: 1 mg via ORAL
  Filled 2020-10-09: qty 1

## 2020-10-09 MED ORDER — BUPROPION HCL ER (XL) 150 MG PO TB24: 150.0000 mg | ORAL_TABLET | ORAL | Status: DC

## 2020-10-09 MED ORDER — BUPROPION HCL ER (XL) 150 MG PO TB24
150.0000 mg | ORAL_TABLET | Freq: Every day | ORAL | Status: DC
  Administered 2020-10-09: 150 mg via ORAL
  Filled 2020-10-09: qty 1

## 2020-10-09 MED ORDER — QUETIAPINE FUMARATE 50 MG PO TABS
100.0000 mg | ORAL_TABLET | Freq: Every day | ORAL | Status: DC
  Administered 2020-10-09: 100 mg via ORAL
  Filled 2020-10-09 (×2): qty 1

## 2020-10-09 NOTE — Consult Note (Signed)
Telepsych Consultation   Location of Patient: [facility] Location of Provider: [facility]  Patient Identification: Olivia Le MRN:  [id number] Principal Diagnosis: Alcohol use disorder, severe, dependence (HCC) Diagnosis:  Principal Problem:   Alcohol use disorder, severe, dependence (HCC)   Total Time spent with patient: 30 minutes  HPI:  Assessment today: Patient seen via telepsych. Chart reviewed. Ms. Olivia Le is a 29 year old female with history of alcohol use disorder, MDD, GAD, ADHD, presenting under IVC from law enforcement for erratic and aggressive behaviors while intoxicated with alcohol. Admission BAL 222. She was seen for similar presentation last week and discharged from ED after sober and stable.  On assessment, patient is calm and cooperative, states, "I get angry and say things I don't mean when I'm drunk." Nursing reports no behavioral concerns today. Patient admits to history of depression but reports good mood over the last two weeks on the days when she does not drink. Denies symptoms of depression. She denies current SI and denies ever having SI when sober. She is seen at the [facility] for outpatient medication management and reports medication compliance. She reports recently accepting that she is an alcoholic. She is connected with AA and has a sponsor. She was supposed to have initial appointment with addictions counselor today but will have to reschedule.   Patient states she stopped drinking after leaving the ED last week, but relapsed yesterday morning due to feeling stressed that all her life decisions have to be related to her addiction. Her friend tracked her to a parking lot with her phone last night and called law enforcement when she was not waking up due to intoxication, but patient does not remember events from last night. Patient reports shame and guilt related to drinking but otherwise denies depressed mood. Denies HI/AVH. Denies any history of  aggressive behaviors while sober. She states she is working on Pharmacologist for cravings, and also states she will call her AA sponsor or other supports when she is having cravings. She lives with her parents, who are supportive. She is unsure if she will go to a rehab facility or continue with current AA and addictions counselor. I expressed to patient my concern for potential self-harm with relapse, particularly when combined with medications, and concern with two recent relapses. Patient states understanding. We also discussed CDIOP as a potential option. Patient states she already has information for rehab facilities as well as [facility] CDIOP and can follow up on her own tomorrow. Declines referrals.  Per prior assessment 10/03/2020: Olivia Le is a 29 y.o. female patient admitted via IVC for making suicidal statements while intoxicated. Patient acknowledges drinking on day prior, not clear on everything said or done while intoxicated.   "Major depressive and alcohol induced episodes. The same reason I was here in April, I get depressed and drink and get irrational and start acting out. The expectation of what I want to be and where I currently am. I'm dealing with a break-up and alcohol. I went to [facility] in August and stayed for a month (28 days); I couldn't take the extra eight weeks because I had to work. I would love long-term that I can pay to go to. I know I need to stop drinking. 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 I'm on meds, I need something and would like to be on something else. Moreso for anxiety; the Wellbutrin added helped".   Patient sees  [facility]) for medication management and will begin counseling with an addiction specialist for weekly sessions this week Darl Pikes [facility]ison). Interested in possible IOP; states if they have alcohol and drug test "that would really help with accountability". States she attends AA  and has a sponsor, "Everything is going great. I just need to learn to cope. I recently moved back in with my parents so I could sell my townhouse to keep me from isolating. They're a great support"  On assessment patient presents calm and cooperative; fully engaged and answering all questions appropriately. Patient is currently denying any suicidal/homicidal ideations, auditory/visual hallucinations, and does not appear to be responding to any external/internal stimuli.   Collateral: Olivia Le [phone] pt's mom 12:55pm "As you can see she's a wonderful person until she starts drinking. I agree we need to have something in place for her long-term treatment-wise. I've been looking into things. Someone told me a place communal living places [facility]" and we're looking for some other longer-term treatments. Keeping her clearly won't do her any good. Obviously she is not in any immediate danger so I'm fine with her being released".    Disposition: Patient with substance-induced suicidal threats and aggressive behaviors. Currently sober and shows no acute risk of harm to self or others. Patient expresses insight into her problem with alcohol and expresses intent to follow up with rehab or CDIOP, as well as current AA and addictions counselor. She is psych cleared for discharge. ED staff updated.  Past Psychiatric History: History of alcohol use disorder, MDD, GAD, ADHD. One prior hospitalization at [facility] in May 2021 for depression with SI and alcohol use. Rehab at [facility] this past fall. Seen outpatient at [facility].  Risk to Self:   Risk to Others:   Prior Inpatient Therapy:   Prior Outpatient Therapy:    Past Medical History:  Past Medical History:  Diagnosis Date  . Kidney stone   . Polycystic disease, ovaries     Past Surgical History:  Procedure Laterality Date  . TONSILLECTOMY     Family History:  Family History  Problem Relation Age of Onset  . Hypertension Mother    . Healthy Father    Family Psychiatric  History: Brother and mother with depression Social History:  Social History   Substance and Sexual Activity  Alcohol Use Yes   Comment: weekly     Social History   Substance and Sexual Activity  Drug Use No    Social History   Socioeconomic History  . Marital status: Single    Spouse name: Not on file  . Number of children: Not on file  . Years of education: Not on file  . Highest education level: Not on file  Occupational History  . Not on file  Tobacco Use  . Smoking status: Never Smoker  . Smokeless tobacco: Never Used  Vaping Use  . Vaping Use: Some days  . Substances: THC, CBD  . Devices: Jule  Substance and Sexual Activity  . Alcohol use: Yes    Comment: weekly  . Drug use: No  . Sexual activity: Not on file  Other Topics Concern  . Not on file  Social History Narrative  . Not on file   Social Determinants of Health   Financial Resource Strain:   . Difficulty of Paying Living Expenses: Not on file  Food Insecurity:   . Worried About Programme researcher, broadcasting/film/video in the Last Year: Not on file  . Ran Out  of Food in the Last Year: Not on file  Transportation Needs:   . Lack of Transportation (Medical): Not on file  . Lack of Transportation (Non-Medical): Not on file  Physical Activity:   . Days of Exercise per Week: Not on file  . Minutes of Exercise per Session: Not on file  Stress:   . Feeling of Stress : Not on file  Social Connections:   . Frequency of Communication with Friends and Family: Not on file  . Frequency of Social Gatherings with Friends and Family: Not on file  . Attends Religious Services: Not on file  . Active Member of Clubs or Organizations: Not on file  . Attends Banker Meetings: Not on file  . Marital Status: Not on file   Additional Social History:    Allergies:  No Known Allergies  Labs:  Results for orders placed or performed during the hospital encounter of 10/08/20 (from  the past 48 hour(s))  Rapid urine drug screen (hospital performed)     Status: None   Collection Time: 10/08/20 10:34 PM  Result Value Ref Range   Opiates NONE DETECTED NONE DETECTED   Cocaine NONE DETECTED NONE DETECTED   Benzodiazepines NONE DETECTED NONE DETECTED   Amphetamines NONE DETECTED NONE DETECTED   Tetrahydrocannabinol NONE DETECTED NONE DETECTED   Barbiturates NONE DETECTED NONE DETECTED    Comment: (NOTE) DRUG SCREEN FOR MEDICAL PURPOSES ONLY.  IF CONFIRMATION IS NEEDED FOR ANY PURPOSE, NOTIFY LAB WITHIN 5 DAYS.  LOWEST DETECTABLE LIMITS FOR URINE DRUG SCREEN Drug Class                     Cutoff (ng/mL) Amphetamine and metabolites    1000 Barbiturate and metabolites    200 Benzodiazepine                 200 Tricyclics and metabolites     300 Opiates and metabolites        300 Cocaine and metabolites        300 THC                            50 Performed at [facility], [address] [place], [place] [address]   Comprehensive metabolic panel     Status: None   Collection Time: 10/08/20 10:40 PM  Result Value Ref Range   Sodium 142 135 - 145 mmol/L   Potassium 4.4 3.5 - 5.1 mmol/L   Chloride 103 98 - 111 mmol/L   CO2 27 22 - 32 mmol/L   Glucose, Bld 82 70 - 99 mg/dL    Comment: Glucose reference range applies only to samples taken after fasting for at least 8 hours.   BUN 11 6 - 20 mg/dL   Creatinine, Ser 6.04 0.44 - 1.00 mg/dL   Calcium 54.0 8.9 - 98.1 mg/dL   Total Protein 7.9 6.5 - 8.1 g/dL   Albumin 4.4 3.5 - 5.0 g/dL   AST 17 15 - 41 U/L   ALT 21 0 - 44 U/L   Alkaline Phosphatase 76 38 - 126 U/L   Total Bilirubin 0.5 0.3 - 1.2 mg/dL   GFR, Estimated >19 >14 mL/min    Comment: (NOTE) Calculated using the CKD-EPI Creatinine Equation (2021)    Anion gap 12 5 - 15    Comment: Performed at [facility], [address] [place], [place] [address]  Ethanol  Status: Abnormal   Collection Time: 10/08/20 10:40 PM  Result Value Ref Range    Alcohol, Ethyl (B) 222 (H) <10 mg/dL    Comment: (NOTE) Lowest detectable limit for serum alcohol is 10 mg/dL.  For medical purposes only. Performed at [facility], [address] [place], [place] [address]   Salicylate level     Status: Abnormal   Collection Time: 10/08/20 10:40 PM  Result Value Ref Range   Salicylate Lvl <7.0 (L) 7.0 - 30.0 mg/dL    Comment: Performed at [facility], [address] [place], [place] [address]  Acetaminophen level     Status: Abnormal   Collection Time: 10/08/20 10:40 PM  Result Value Ref Range   Acetaminophen (Tylenol), Serum <10 (L) 10 - 30 ug/mL    Comment: (NOTE) Therapeutic concentrations vary significantly. A range of 10-30 ug/mL  may be an effective concentration for many patients. However, some  are best treated at concentrations outside of this range. Acetaminophen concentrations >150 ug/mL at 4 hours after ingestion  and >50 ug/mL at 12 hours after ingestion are often associated with  toxic reactions.  Performed at [facility], [address] [place], [place] [address]   cbc     Status: Abnormal   Collection Time: 10/08/20 10:40 PM  Result Value Ref Range   WBC 11.8 (H) 4.0 - 10.5 K/uL   RBC 4.82 3.87 - 5.11 MIL/uL   Hemoglobin 13.4 12.0 - 15.0 g/dL   HCT 21.342.1 36 - 46 %   MCV 87.3 80.0 - 100.0 fL   MCH 27.8 26.0 - 34.0 pg   MCHC 31.8 30.0 - 36.0 g/dL   RDW 08.614.4 57.811.5 - 46.915.5 %   Platelets 353 150 - 400 K/uL   nRBC 0.2 0.0 - 0.2 %    Comment: Performed at [facility], [address] [place], [place] [address]  I-Stat beta hCG blood, ED     Status: None   Collection Time: 10/09/20 12:52 AM  Result Value Ref Range   I-stat hCG, quantitative <5.0 <5 mIU/mL   Comment 3            Comment:   GEST. AGE      CONC.  (mIU/mL)   <=1 WEEK        5 - 50     2 WEEKS       50 - 500     3 WEEKS       100 - 10,000     4 WEEKS     1,000 - 30,000        FEMALE AND NON-PREGNANT FEMALE:     LESS THAN 5  mIU/mL   Resp Panel by RT-PCR (Flu A&B, Covid) Nasopharyngeal Swab     Status: None   Collection Time: 10/09/20  2:44 AM   Specimen: Nasopharyngeal Swab; Nasopharyngeal(NP) swabs in vial transport medium  Result Value Ref Range   SARS Coronavirus 2 by RT PCR NEGATIVE NEGATIVE    Comment: (NOTE) SARS-CoV-2 target nucleic acids are NOT DETECTED.  The SARS-CoV-2 RNA is generally detectable in upper respiratory specimens during the acute phase of infection. The lowest concentration of SARS-CoV-2 viral copies this assay can detect is 138 copies/mL. A negative result does not preclude SARS-Cov-2 infection and should not be used as the sole basis for treatment or other patient management decisions. A negative result may occur with  improper specimen collection/handling, submission of specimen other than  nasopharyngeal swab, presence of viral mutation(s) within the areas targeted by this assay, and inadequate number of viral copies(<138 copies/mL). A negative result must be combined with clinical observations, patient history, and epidemiological information. The expected result is Negative.  Fact Sheet for Patients:  BloggerCourse.com  Fact Sheet for Healthcare Providers:  SeriousBroker.it  This test is no t yet approved or cleared by the [place] FDA and  has been authorized for detection and/or diagnosis of SARS-CoV-2 by FDA under an Emergency Use Authorization (EUA). This EUA will remain  in effect (meaning this test can be used) for the duration of the COVID-19 declaration under Section 564(b)(1) of the Act, 21 U.S.C.section 360bbb-3(b)(1), unless the authorization is terminated  or revoked sooner.       Influenza A by PCR NEGATIVE NEGATIVE   Influenza B by PCR NEGATIVE NEGATIVE    Comment: (NOTE) The Xpert Xpress SARS-CoV-2/FLU/RSV plus assay is intended as an aid in the diagnosis of influenza from Nasopharyngeal swab  specimens and should not be used as a sole basis for treatment. Nasal washings and aspirates are unacceptable for Xpert Xpress SARS-CoV-2/FLU/RSV testing.  Fact Sheet for Patients: BloggerCourse.com  Fact Sheet for Healthcare Providers: SeriousBroker.it  This test is not yet approved or cleared by the [place] FDA and has been authorized for detection and/or diagnosis of SARS-CoV-2 by FDA under an Emergency Use Authorization (EUA). This EUA will remain in effect (meaning this test can be used) for the duration of the COVID-19 declaration under Section 564(b)(1) of the Act, 21 U.S.C. section 360bbb-3(b)(1), unless the authorization is terminated or revoked.  Performed at [facility], [address], [place], [place] [address]     Medications:  Current Facility-Administered Medications  Medication Dose Route Frequency Provider Last Rate Last Admin  . alum & mag hydroxide-simeth (MAALOX/MYLANTA) 200-200-20 MG/5ML suspension 30 mL  30 mL Oral Q6H PRN Petrucelli, Samantha R, PA-C      . atomoxetine (STRATTERA) capsule 80 mg  80 mg Oral Daily Petrucelli, Samantha R, PA-C   80 mg at 10/09/20 1145  . buPROPion (WELLBUTRIN XL) 24 hr tablet 150 mg  150 mg Oral Daily Cardama, Amadeo Garnet, MD   150 mg at 10/09/20 1044  . escitalopram (LEXAPRO) tablet 20 mg  20 mg Oral Daily Petrucelli, Samantha R, PA-C   20 mg at 10/09/20 1044  . gabapentin (NEURONTIN) capsule 400 mg  400 mg Oral Daily PRN Petrucelli, Samantha R, PA-C      . hydrOXYzine (ATARAX/VISTARIL) tablet 25 mg  25 mg Oral TID PRN Petrucelli, Samantha R, PA-C      . ibuprofen (ADVIL) tablet 400 mg  400 mg Oral Q8H PRN Petrucelli, Samantha R, PA-C      . LORazepam (ATIVAN) injection 0-4 mg  0-4 mg Intravenous Q6H Petrucelli, Samantha R, PA-C       Or  . LORazepam (ATIVAN) tablet 0-4 mg  0-4 mg Oral Q6H Petrucelli, Samantha R, PA-C   1 mg at 10/09/20 1042  . [START ON  10/11/2020] LORazepam (ATIVAN) injection 0-4 mg  0-4 mg Intravenous Q12H Petrucelli, Samantha R, PA-C       Or  . [START ON 10/11/2020] LORazepam (ATIVAN) tablet 0-4 mg  0-4 mg Oral Q12H Petrucelli, Samantha R, PA-C      . ondansetron (ZOFRAN) tablet 4 mg  4 mg Oral Q8H PRN Petrucelli, Samantha R, PA-C      . QUEtiapine (SEROQUEL) tablet 100 mg  100 mg Oral QHS Petrucelli, Samantha  R, PA-C   100 mg at 10/09/20 0315  . thiamine tablet 100 mg  100 mg Oral Daily Petrucelli, Samantha R, PA-C   100 mg at 10/09/20 1045   Or  . thiamine (B-1) injection 100 mg  100 mg Intravenous Daily Petrucelli, Samantha R, PA-C       Current Outpatient Medications  Medication Sig Dispense Refill  . atomoxetine (STRATTERA) 80 MG capsule Take 1 capsule (80 mg total) by mouth daily. 30 capsule 2  . buPROPion (WELLBUTRIN XL) 150 MG 24 hr tablet Take 1 tablet (150 mg total) by mouth every morning. 30 tablet 2  . escitalopram (LEXAPRO) 10 MG tablet Take 2 tablets (20 mg total) by mouth daily. 30 tablet 2  . gabapentin (NEURONTIN) 400 MG capsule Take 1 capsule (400 mg total) by mouth daily. (Patient taking differently: Take 400 mg by mouth daily as needed (nerve pain). ) 30 capsule 2  . hydrOXYzine (VISTARIL) 25 MG capsule Take 1 capsule (25 mg total) by mouth 3 (three) times daily. (Patient taking differently: Take 25 mg by mouth 3 (three) times daily as needed for anxiety. ) 90 capsule 2  . QUEtiapine (SEROQUEL) 100 MG tablet TAKE 1 TABLET BY MOUTH EVERYDAY AT BEDTIME (Patient taking differently: Take 100 mg by mouth at bedtime. ) 30 tablet 1  . traZODone (DESYREL) 50 MG tablet Take 1 tablet (50 mg total) by mouth at bedtime as needed. for sleep (Patient not taking: Reported on 10/05/2020) 30 tablet 1    Psychiatric Specialty Exam: Physical Exam  Review of Systems  Blood pressure 117/76, pulse (!) 105, temperature 98.1 F (36.7 C), temperature source Oral, resp. rate 17, SpO2 98 %.There is no height or weight on file to  calculate BMI.  General Appearance: Fairly Groomed  Eye Contact:  Good  Speech:  Normal Rate  Volume:  Normal  Mood:  Euthymic  Affect:  Appropriate and Congruent  Thought Process:  Coherent and Goal Directed  Orientation:  Full (Time, Place, and Person)  Thought Content:  Logical  Suicidal Thoughts:  No  Homicidal Thoughts:  No  Memory:  Immediate;   Good Recent;   Poor Remote;   Good  Judgement:  Intact  Insight:  Good  Psychomotor Activity:  Normal  Concentration:  Concentration: Good and Attention Span: Good  Recall:  Good  Fund of Knowledge:  Good  Language:  Good  Akathisia:  No  Handed:  Right  AIMS (if indicated):     Assets:  Communication Skills Desire for Improvement Housing Resilience Social Support  ADL's:  Intact  Cognition:  WNL  Sleep:       Disposition: Patient with substance-induced suicidal threats and aggressive behaviors. Currently sober and shows no acute risk of harm to self or others. Patient expresses insight into her problem with alcohol and expresses intent to follow up with rehab or CDIOP, as well as current AA and addictions counselor. She is psych cleared for discharge. ED staff updated.  This service was provided via telemedicine using a 2-way, interactive audio and video technology with the identified patient and this Clinical research associate.  Aldean Baker, NP 10/09/2020 5:12 PM

## 2020-10-09 NOTE — ED Notes (Signed)
Lunch Tray Ordered @ 1036. 
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
## 2020-10-09 NOTE — ED Notes (Signed)
DC instructions reviewed with pt. PT verbalized understanding. Pt DC °

## 2020-10-09 NOTE — ED Provider Notes (Signed)
I was informed by the nurse that behavioral health as psychiatrically cleared patient.  She is under an IVC which I have rescinded.  Appropriate for discharge.   Terrilee Files, MD 10/10/20 1115

## 2020-10-10 ENCOUNTER — Telehealth (HOSPITAL_COMMUNITY): Payer: Self-pay | Admitting: *Deleted

## 2020-10-10 ENCOUNTER — Other Ambulatory Visit (HOSPITAL_COMMUNITY): Payer: Self-pay | Admitting: Psychiatry

## 2020-10-10 DIAGNOSIS — F900 Attention-deficit hyperactivity disorder, predominantly inattentive type: Secondary | ICD-10-CM

## 2020-10-10 DIAGNOSIS — F331 Major depressive disorder, recurrent, moderate: Secondary | ICD-10-CM

## 2020-10-10 DIAGNOSIS — F411 Generalized anxiety disorder: Secondary | ICD-10-CM

## 2020-10-10 DIAGNOSIS — F1020 Alcohol dependence, uncomplicated: Secondary | ICD-10-CM

## 2020-10-10 MED ORDER — ESCITALOPRAM OXALATE 10 MG PO TABS: 20.0000 mg | ORAL_TABLET | Freq: Every day | ORAL | 2 refills | Status: DC

## 2020-10-10 MED ORDER — BUPROPION HCL ER (XL) 150 MG PO TB24: 150.0000 mg | ORAL_TABLET | ORAL | 2 refills | Status: DC

## 2020-10-10 MED ORDER — QUETIAPINE FUMARATE 100 MG PO TABS: ORAL_TABLET | ORAL | 1 refills | Status: DC

## 2020-10-10 MED ORDER — ATOMOXETINE HCL 80 MG PO CAPS: 80.0000 mg | ORAL_CAPSULE | Freq: Every day | ORAL | 2 refills | Status: DC

## 2020-10-10 MED ORDER — BUSPIRONE HCL 10 MG PO TABS: 10.0000 mg | ORAL_TABLET | Freq: Three times a day (TID) | ORAL | 2 refills | Status: DC

## 2020-10-10 MED ORDER — HYDROXYZINE PAMOATE 25 MG PO CAPS: 25.0000 mg | ORAL_CAPSULE | Freq: Three times a day (TID) | ORAL | 2 refills | Status: DC

## 2020-10-10 NOTE — Telephone Encounter (Signed)
Olivia Le informed Clinical research associate that she has been more anxious and notes that she has been out of her Lexapro for 2 weeks.  She also notes that she finds hydroxyzine ineffective and would like something else to help manage her anxiety.  She is agreeable to starting BuSpar 10 mg 3 times daily.  Olivia Le also informed provider that she recently discharged from [facility] where she sought treatment for her alcohol use.  She informed provider that she would like to receive Vivitrol injection.  Provider informed Olivia Le that injection could be administered if Olivia Le is agreeable to have a urine drug screen done.  Provider informed Olivia Le that Vivitrol is contraindicated while using opioids or other illegal substances.  She endorsed understanding and agreed.  Pending urine drug screen provider will prescribe Vivitrol 380 mg every 4 weeks.  No other concerns at this time.

## 2020-10-10 NOTE — Telephone Encounter (Signed)
VM left for writer stating she relapsed and missed her virtual appt with Brittney NP yesterday but really needs her Lexapro.  She also is interested in getting Vivitrol shots. She would like someone to call her back. Will forward her concerns to Toy Cookey NP.

## 2020-10-11 ENCOUNTER — Other Ambulatory Visit: Payer: Self-pay | Admitting: Psychiatry

## 2020-10-12 ENCOUNTER — Telehealth (HOSPITAL_COMMUNITY): Payer: Self-pay | Admitting: *Deleted

## 2020-10-12 NOTE — Telephone Encounter (Signed)
Call from patient seeking Vivatrol injection or Naltrexone in the interim if Vivatrol is not available. Clarified with her insurance and she has BCBS now, she did have insurance when we started serving her. Communicated with Brittney her provider and my supervisor re moving her care to [place] office where they are already using Vivatrol and have a more established program for SAIOP. Per supervisor called Arlina Robes at [facility] and the coordinator of the program who would schedule [name] has left for the day but we will talk Monday to coordinate her care. Called [facility] to update her with current process and she is agreeable to the plan of moving to [place] office, she states she is desperate for this service and has benefitted from Vivotrol in the past.

## 2020-10-12 NOTE — Telephone Encounter (Signed)
Thank you for the update!

## 2020-10-15 LAB — PAIN MGT SCRN (14 DRUGS), UR
Amphetamine Scrn, Ur: NEGATIVE ng/mL
BARBITURATE SCREEN URINE: NEGATIVE ng/mL
BENZODIAZEPINE SCREEN, URINE: NEGATIVE ng/mL
Buprenorphine, Urine: NEGATIVE ng/mL
CANNABINOIDS UR QL SCN: NEGATIVE ng/mL
Cocaine (Metab) Scrn, Ur: NEGATIVE ng/mL
Creatinine(Crt), U: 117.3 mg/dL (ref 20.0–300.0)
Fentanyl, Urine: NEGATIVE pg/mL
Meperidine Screen, Urine: NEGATIVE ng/mL
Methadone Screen, Urine: NEGATIVE ng/mL
OXYCODONE+OXYMORPHONE UR QL SCN: NEGATIVE ng/mL
Opiate Scrn, Ur: NEGATIVE ng/mL
Ph of Urine: 6.1 (ref 4.5–8.9)
Phencyclidine Qn, Ur: NEGATIVE ng/mL
Propoxyphene Scrn, Ur: NEGATIVE ng/mL
Tramadol Screen, Urine: NEGATIVE ng/mL

## 2020-10-17 ENCOUNTER — Telehealth (HOSPITAL_COMMUNITY): Payer: Self-pay | Admitting: Licensed Clinical Social Worker

## 2020-10-18 ENCOUNTER — Telehealth (HOSPITAL_COMMUNITY): Payer: Self-pay | Admitting: *Deleted

## 2020-10-18 NOTE — Telephone Encounter (Signed)
Message from Fulton Reek at [place] office she had reached out to patient re her moving her care to [facility] for substance abuse and expressesly for her interest in Vivatrol injections. She is to call Karissa back. Trayce Caravello the info and she expressed her appreciation and she will call [facility] now.

## 2020-10-19 ENCOUNTER — Other Ambulatory Visit (HOSPITAL_COMMUNITY): Payer: Self-pay | Admitting: Psychiatry

## 2020-10-19 ENCOUNTER — Ambulatory Visit (HOSPITAL_COMMUNITY): Payer: No Payment, Other | Admitting: Licensed Clinical Social Worker

## 2020-10-19 ENCOUNTER — Other Ambulatory Visit: Payer: Self-pay

## 2020-10-19 DIAGNOSIS — F331 Major depressive disorder, recurrent, moderate: Secondary | ICD-10-CM

## 2020-11-03 ENCOUNTER — Other Ambulatory Visit (HOSPITAL_COMMUNITY): Payer: Self-pay | Admitting: Psychiatry

## 2020-11-03 DIAGNOSIS — F411 Generalized anxiety disorder: Secondary | ICD-10-CM

## 2020-11-06 ENCOUNTER — Ambulatory Visit (HOSPITAL_COMMUNITY): Payer: Self-pay | Admitting: Clinical

## 2020-11-23 ENCOUNTER — Telehealth (HOSPITAL_COMMUNITY): Payer: 59 | Admitting: Psychiatry

## 2020-11-23 ENCOUNTER — Other Ambulatory Visit: Payer: Self-pay

## 2020-12-20 ENCOUNTER — Telehealth (HOSPITAL_COMMUNITY): Payer: Self-pay | Admitting: *Deleted

## 2020-12-20 DIAGNOSIS — F411 Generalized anxiety disorder: Secondary | ICD-10-CM

## 2020-12-20 DIAGNOSIS — F900 Attention-deficit hyperactivity disorder, predominantly inattentive type: Secondary | ICD-10-CM

## 2020-12-20 DIAGNOSIS — F331 Major depressive disorder, recurrent, moderate: Secondary | ICD-10-CM

## 2020-12-20 MED ORDER — ATOMOXETINE HCL 80 MG PO CAPS: 80.0000 mg | ORAL_CAPSULE | Freq: Every day | ORAL | 0 refills | Status: DC

## 2020-12-20 MED ORDER — ESCITALOPRAM OXALATE 10 MG PO TABS: 20.0000 mg | ORAL_TABLET | Freq: Every day | ORAL | 0 refills | Status: DC

## 2020-12-20 MED ORDER — TRAZODONE HCL 50 MG PO TABS: 50.0000 mg | ORAL_TABLET | Freq: Every evening | ORAL | 0 refills | Status: DC | PRN

## 2020-12-20 MED ORDER — HYDROXYZINE PAMOATE 25 MG PO CAPS: 25.0000 mg | ORAL_CAPSULE | Freq: Three times a day (TID) | ORAL | 0 refills | Status: DC

## 2020-12-20 MED ORDER — BUSPIRONE HCL 10 MG PO TABS: 10.0000 mg | ORAL_TABLET | Freq: Three times a day (TID) | ORAL | 0 refills | Status: DC

## 2020-12-20 MED ORDER — GABAPENTIN 400 MG PO CAPS: 400.0000 mg | ORAL_CAPSULE | Freq: Every day | ORAL | 0 refills | Status: DC

## 2020-12-20 MED ORDER — QUETIAPINE FUMARATE 100 MG PO TABS
ORAL_TABLET | ORAL | 0 refills | Status: DC
Start: 2020-12-20 — End: 2021-03-04

## 2020-12-20 MED ORDER — BUPROPION HCL ER (XL) 150 MG PO TB24: 150.0000 mg | ORAL_TABLET | ORAL | 0 refills | Status: DC

## 2020-12-20 NOTE — Telephone Encounter (Signed)
Patient called & stated she was in 'treatment' & missed appointment w/ DNP Doyne Keel stated she needs refills on her med's  & the Lexapro she only has a few days supply left.

## 2020-12-20 NOTE — Telephone Encounter (Signed)
Rx sent 

## 2020-12-20 NOTE — Telephone Encounter (Signed)
Reopened by mistake

## 2020-12-20 NOTE — Addendum Note (Signed)
Addended by: Zena Amos on: 12/20/2020 03:36 PM   Modules accepted: Orders

## 2020-12-20 NOTE — Telephone Encounter (Signed)
Patient requested refill on LEXAPRO missed appointment with DNP Doyne Keel while in 'treatment'

## 2021-01-02 ENCOUNTER — Other Ambulatory Visit: Payer: Self-pay

## 2021-01-02 ENCOUNTER — Telehealth (INDEPENDENT_AMBULATORY_CARE_PROVIDER_SITE_OTHER): Payer: No Payment, Other | Admitting: Psychiatry

## 2021-01-02 ENCOUNTER — Other Ambulatory Visit (HOSPITAL_COMMUNITY): Payer: Self-pay | Admitting: Psychiatry

## 2021-01-02 ENCOUNTER — Encounter (HOSPITAL_COMMUNITY): Payer: Self-pay | Admitting: Psychiatry

## 2021-01-02 DIAGNOSIS — F411 Generalized anxiety disorder: Secondary | ICD-10-CM | POA: Diagnosis not present

## 2021-01-02 DIAGNOSIS — F900 Attention-deficit hyperactivity disorder, predominantly inattentive type: Secondary | ICD-10-CM | POA: Diagnosis not present

## 2021-01-02 DIAGNOSIS — F1011 Alcohol abuse, in remission: Secondary | ICD-10-CM | POA: Diagnosis not present

## 2021-01-02 DIAGNOSIS — F331 Major depressive disorder, recurrent, moderate: Secondary | ICD-10-CM | POA: Diagnosis not present

## 2021-01-02 MED ORDER — ATOMOXETINE HCL 100 MG PO CAPS: 100.0000 mg | ORAL_CAPSULE | Freq: Every day | ORAL | 2 refills | Status: DC

## 2021-01-02 MED ORDER — GABAPENTIN 300 MG PO CAPS: 300.0000 mg | ORAL_CAPSULE | Freq: Every day | ORAL | 2 refills | Status: DC

## 2021-01-02 MED ORDER — NALTREXONE HCL 50 MG PO TABS: 50.0000 mg | ORAL_TABLET | Freq: Every day | ORAL | 2 refills | Status: DC

## 2021-01-02 MED ORDER — TRAZODONE HCL 50 MG PO TABS: 50.0000 mg | ORAL_TABLET | Freq: Every evening | ORAL | 2 refills | Status: DC | PRN

## 2021-01-02 MED ORDER — ESCITALOPRAM OXALATE 20 MG PO TABS: 20.0000 mg | ORAL_TABLET | Freq: Every day | ORAL | 2 refills | Status: DC

## 2021-01-02 MED ORDER — BUPROPION HCL ER (XL) 300 MG PO TB24: 300.0000 mg | ORAL_TABLET | ORAL | 2 refills | Status: DC

## 2021-01-02 MED ORDER — PROPRANOLOL HCL 10 MG PO TABS: 10.0000 mg | ORAL_TABLET | Freq: Three times a day (TID) | ORAL | 2 refills | Status: DC

## 2021-01-02 MED ORDER — BUSPIRONE HCL 15 MG PO TABS: 15.0000 mg | ORAL_TABLET | Freq: Three times a day (TID) | ORAL | 2 refills | Status: DC

## 2021-01-02 NOTE — Progress Notes (Signed)
BH MD/PA/NP OP Progress Note Virtual Visit via Video Note  I connected with Olivia Le on 01/02/21 at 10:00 AM EST by a video enabled telemedicine application and verified that I am speaking with the correct person using two identifiers.  Location: Patient: Home Provider: Clinic   I discussed the limitations of evaluation and management by telemedicine and the availability of in person appointments. The patient expressed understanding and agreed to proceed.  I provided 30 minutes of non-face-to-face time during this encounter.    01/02/2021 10:38 AM Olivia Le  MRN:  [id number]  Chief Complaint: "I have a long list that we need to talk about".  HPI: 30 year old female seen today for follow-up psychiatric evaluation.  She has a psychiatric history of anxiety, depression, ADHD, alcohol dependence, and SI.  She informed Clinical research associate that she recently was discharged from [facility] recovery center where she was for 5 weeks to help manage her alcohol use.  She notes that she has now been sober from alcohol for 70 days.  She is now being managed on propanolol 10 mg 3 times daily, trazodone 50 mg nightly, Strattera 100 mg daily, Seroquel 100 mg nightly, naltrexone 50 mg daily, hydroxyzine 25 mg every 6 hours as needed, Lexapro 20 mg daily, and gabapentin 400 mg daily.  She notes that she dislikes hydroxyzine and reports that she would like to discontinue it.    Today patient is pleasant, cooperative, and engaged in conversation.  She notes that since being discharged from [facility] recovery center she feels clear, has more energy, is able to focus, and does not feel like her brain is foggy.  She however notes that at times she feels anxious and reports that she has obsessive thoughts playing in her mind which makes her tense.  Today provider conducted a GAD-7 and patient scored a 13, at her last visit she scored a 9.  Provider also conducted a PHQ-9 and patient scored 11, at her last visit she  scored a 12.  Today she denies SI/HI/VAH or paranoia.  She endorses adequate sleep however notes that her appetite has been poor since starting naltrexone.  She informed provider that she believes she lost about 10 pounds.  Patient notes that she would like to make adjustments to her medications to help manage her anxiety and depression. Today she is agreeable to increasing gabapentin 400 mg to 300 mg 3 times daily help manage anxiety.  She is also agreeable to increasing Wellbutrin XL 150 mg to 300 mg daily to help manage depression.  She will increase BuSpar 10 grams 3 times daily to 15 mg 3 times daily to help manage anxiety.  She will continue all other medications as prescribed.  Patient notes that she continues to go to [employer] however notes that she would like to follow-up with therapy.  Patient notes that she will call the clinic and schedule an appointment.  No other concerns at this time.   Visit Diagnosis:    ICD-10-CM   1. GAD (generalized anxiety disorder)  F41.1 gabapentin (NEURONTIN) 300 MG capsule    busPIRone (BUSPAR) 15 MG tablet    propranolol (INDERAL) 10 MG tablet  2. Moderate episode of recurrent major depressive disorder (HCC)  F33.1 traZODone (DESYREL) 50 MG tablet    escitalopram (LEXAPRO) 20 MG tablet    buPROPion (WELLBUTRIN XL) 300 MG 24 hr tablet  3. Attention deficit hyperactivity disorder (ADHD), predominantly inattentive type  F90.0 atomoxetine (STRATTERA) 100 MG capsule  4. Alcohol use disorder, mild,  in early remission  F10.11 naltrexone (DEPADE) 50 MG tablet    Past Psychiatric History: Anxiety, depression, ADHD, and SI  Past Medical History:  Past Medical History:  Diagnosis Date  . Kidney stone   . Polycystic disease, ovaries     Past Surgical History:  Procedure Laterality Date  . TONSILLECTOMY      Family Psychiatric History: Brother Depression and mother depression  Family History:  Family History  Problem Relation Age of Onset  .  Hypertension Mother   . Healthy Father     Social History:  Social History   Socioeconomic History  . Marital status: Single    Spouse name: Not on file  . Number of children: Not on file  . Years of education: Not on file  . Highest education level: Not on file  Occupational History  . Not on file  Tobacco Use  . Smoking status: Never Smoker  . Smokeless tobacco: Never Used  Vaping Use  . Vaping Use: Some days  . Substances: THC, CBD  . Devices: Jule  Substance and Sexual Activity  . Alcohol use: Yes    Comment: weekly  . Drug use: No  . Sexual activity: Not on file  Other Topics Concern  . Not on file  Social History Narrative  . Not on file   Social Determinants of Health   Financial Resource Strain: Not on file  Food Insecurity: Not on file  Transportation Needs: Not on file  Physical Activity: Not on file  Stress: Not on file  Social Connections: Not on file    Allergies: No Known Allergies  Metabolic Disorder Labs: Lab Results  Component Value Date   HGBA1C 4.9 03/29/2020   MPG 93.93 03/29/2020   No results found for: PROLACTIN Lab Results  Component Value Date   CHOL 149 03/29/2020   TRIG 29 03/29/2020   HDL 97 03/29/2020   CHOLHDL 1.5 03/29/2020   VLDL 6 03/29/2020   LDLCALC 46 03/29/2020   Lab Results  Component Value Date   TSH 2.297 03/29/2020    Therapeutic Level Labs: No results found for: LITHIUM No results found for: VALPROATE No components found for:  CBMZ  Current Medications: Current Outpatient Medications  Medication Sig Dispense Refill  . atomoxetine (STRATTERA) 100 MG capsule Take 1 capsule (100 mg total) by mouth daily. 30 capsule 2  . naltrexone (DEPADE) 50 MG tablet Take 1 tablet (50 mg total) by mouth daily. 30 tablet 2  . propranolol (INDERAL) 10 MG tablet Take 1 tablet (10 mg total) by mouth 3 (three) times daily. 90 tablet 2  . Ascorbic Acid (VITAMIN C WITH ROSE HIPS) 500 MG tablet Take 500 mg by mouth daily.     Marland Kitchen. buPROPion (WELLBUTRIN XL) 300 MG 24 hr tablet Take 1 tablet (300 mg total) by mouth every morning. 30 tablet 2  . busPIRone (BUSPAR) 15 MG tablet Take 1 tablet (15 mg total) by mouth 3 (three) times daily. 90 tablet 2  . cholecalciferol (VITAMIN D3) 25 MCG (1000 UNIT) tablet Take 1,000 Units by mouth daily.    Marland Kitchen. escitalopram (LEXAPRO) 20 MG tablet Take 1 tablet (20 mg total) by mouth daily. 30 tablet 2  . gabapentin (NEURONTIN) 300 MG capsule Take 1 capsule (300 mg total) by mouth daily. 90 capsule 2  . hydrOXYzine (VISTARIL) 25 MG capsule Take 1 capsule (25 mg total) by mouth 3 (three) times daily. 90 capsule 0  . QUEtiapine (SEROQUEL) 100 MG tablet TAKE 1 TABLET  BY MOUTH EVERYDAY AT BEDTIME 30 tablet 0  . traZODone (DESYREL) 50 MG tablet Take 1 tablet (50 mg total) by mouth at bedtime as needed. for sleep 30 tablet 2   No current facility-administered medications for this visit.     Musculoskeletal: Strength & Muscle Tone: Unable to assess due to telehealth visit.  Gait & Station: Unable to assess due to telehealth visit.  Patient leans: N/A  Psychiatric Specialty Exam: Review of Systems  There were no vitals taken for this visit.There is no height or weight on file to calculate BMI.  General Appearance: Well Groomed  Eye Contact:  Good  Speech:  Clear and Coherent and Normal Rate  Volume:  Normal  Mood:  Anxious and Depressed  Affect:  Congruent  Thought Process:  Coherent, Goal Directed and Linear  Orientation:  Full (Time, Place, and Person)  Thought Content: WDL and Logical   Suicidal Thoughts:  No  Homicidal Thoughts:  No  Memory:  Immediate;   Good Recent;   Good Remote;   Good  Judgement:  Good  Insight:  Good  Psychomotor Activity:  Normal  Concentration:  Concentration: Good and Attention Span: Good  Recall:  Good  Fund of Knowledge: Good  Language: Good  Akathisia:  No  Handed:  Right  AIMS (if indicated): Not done  Assets:  Communication Skills Desire  for Improvement Financial Resources/Insurance Housing Social Support  ADL's:  Intact  Cognition: WNL  Sleep:  Good   Screenings: AIMS   Flowsheet Row [facility]Dis[facility]  AIMS Total Score 0    AUDIT   Flowsheet Row Admission (Discharged) [facility]  Alcohol Use Disorder Identification Test Final Score (AUDIT) 10    GAD-7   Flowsheet Row Video Visit from 01/02/2021 in [facility] Video Visit from 08/23/2020 in [facility]  Total GAD-7 Score 13 9    PHQ2-9   Flowsheet Row Video Visit from 01/02/2021 in [facility] Video Visit from 08/23/2020 in [facility] Counselor from 06/22/2020 in [facility]  PHQ-2 Total Score 3 3 4   PHQ-9 Total Score 11 12 16     Flowsheet Row Video Visit from 01/02/2021 in [facility] ED from 10/08/2020 in [facility] EMERGENCY DEPARTMENT ED from 06/19/2020 in [facility] EMERGENCY DEPARTMENT  C-SSRS RISK CATEGORY Error: Q7 should not be populated when Q6 is No High Risk Moderate Risk       Assessment and Plan: Patient endorses symptoms of anxiety and depression.Today she is agreeable to increasing gabapentin 400 mg to 300 mg 3 times daily help manage anxiety.  She is also agreeable to increasing Wellbutrin XL 150 mg to 300 mg daily to help manage depression.  She will increase BuSpar 10 grams 3 times daily to 15 mg 3 times daily to help manage anxiety.  She will continue all other medications as prescribed.    1. Moderate episode of recurrent major depressive disorder (HCC)  Continue- traZODone (DESYREL) 50 MG tablet; Take 1 tablet (50 mg total) by mouth at bedtime as needed. for sleep  Dispense: 30 tablet; Refill:  2 Continue- escitalopram (LEXAPRO) 20 MG tablet; Take 1 tablet (20 mg total) by mouth daily.  Dispense: 30 tablet; Refill: 2 Increased- buPROPion (WELLBUTRIN XL) 300 MG 24 hr  tablet; Take 1 tablet (300 mg total) by mouth every morning.  Dispense: 30 tablet; Refill: 2  2. GAD (generalized anxiety disorder)  Increased- gabapentin (NEURONTIN) 300 MG capsule; Take 1 capsule (300 mg total) by mouth daily.  Dispense: 90 capsule; Refill: 2 Increased- busPIRone (BUSPAR) 15 MG tablet; Take 1 tablet (15 mg total) by mouth 3 (three) times daily.  Dispense: 90 tablet; Refill: 2 Continue- propranolol (INDERAL) 10 MG tablet; Take 1 tablet (10 mg total) by mouth 3 (three) times daily.  Dispense: 90 tablet; Refill: 2  3. Attention deficit hyperactivity disorder (ADHD), predominantly inattentive type  Continue- atomoxetine (STRATTERA) 100 MG capsule; Take 1 capsule (100 mg total) by mouth daily.  Dispense: 30 capsule; Refill: 2  4. Alcohol use disorder, mild, in early remission  Continue- naltrexone (DEPADE) 50 MG tablet; Take 1 tablet (50 mg total) by mouth daily.  Dispense: 30 tablet; Refill: 2    Follow-up in 3 months Follow-up with therapy   Shanna Cisco, NP 01/02/2021, 10:38 AM

## 2021-01-24 ENCOUNTER — Other Ambulatory Visit (HOSPITAL_COMMUNITY): Payer: Self-pay | Admitting: Psychiatry

## 2021-01-24 DIAGNOSIS — F331 Major depressive disorder, recurrent, moderate: Secondary | ICD-10-CM

## 2021-02-13 ENCOUNTER — Telehealth (HOSPITAL_COMMUNITY): Payer: Self-pay | Admitting: Psychiatry

## 2021-02-13 NOTE — Telephone Encounter (Signed)
Pt called to change preferred pharmacy to: [facility] [address] [place], [place] [address]. [facility].

## 2021-02-22 IMAGING — DX LEFT RIBS AND CHEST - 3+ VIEW
3 series · 3 of 3 positions shown · non-contrast
Comparison: None.

CLINICAL DATA: Left rib injury after fall June 01, 2019. Pain is
recently increased, particularly with deep inspiration.

EXAM:
LEFT RIBS AND CHEST - 3+ VIEW

[chest pa]
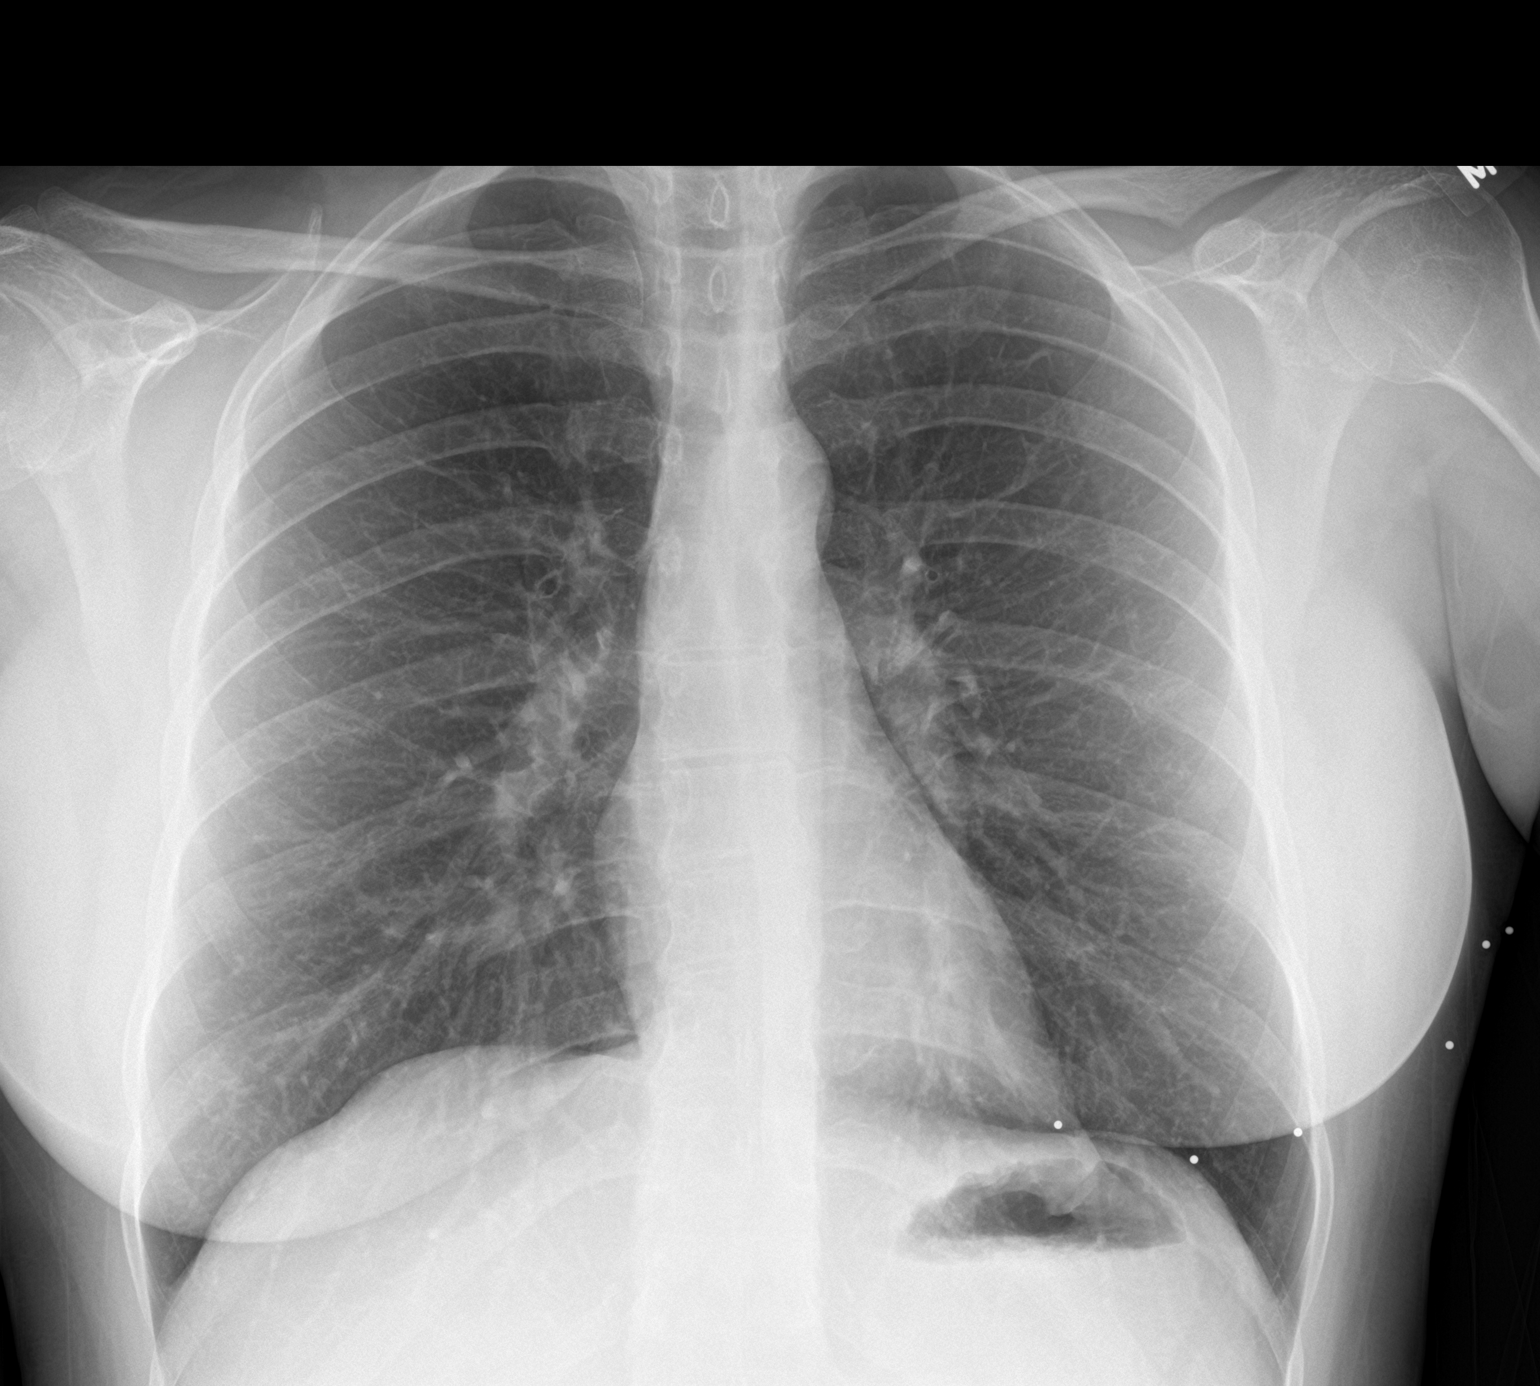

[rib obl]
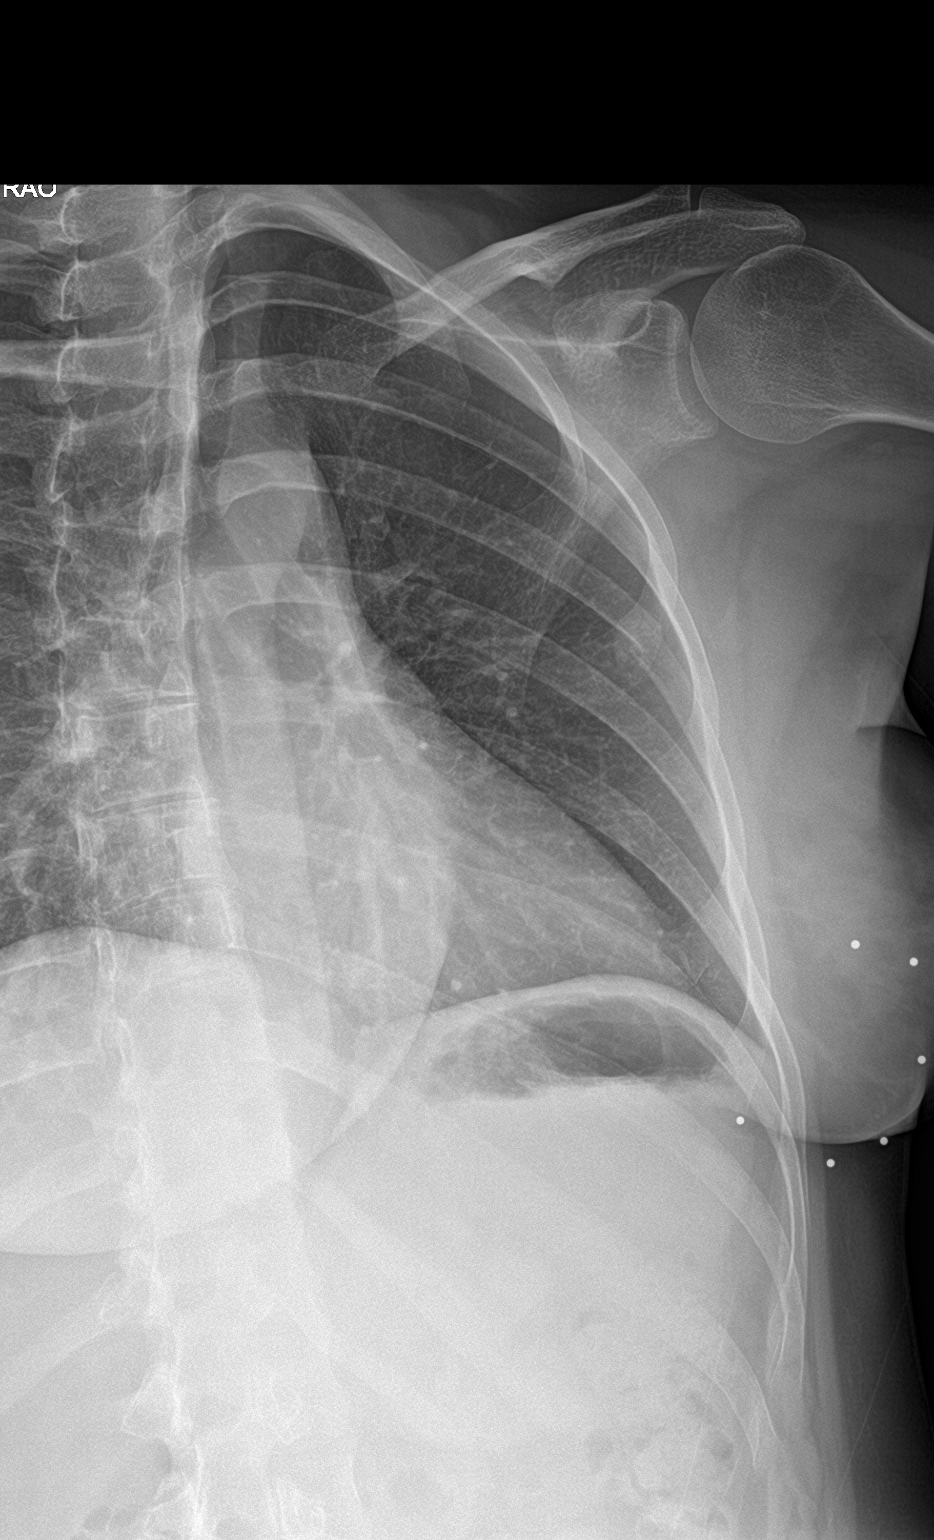

[rib pa]
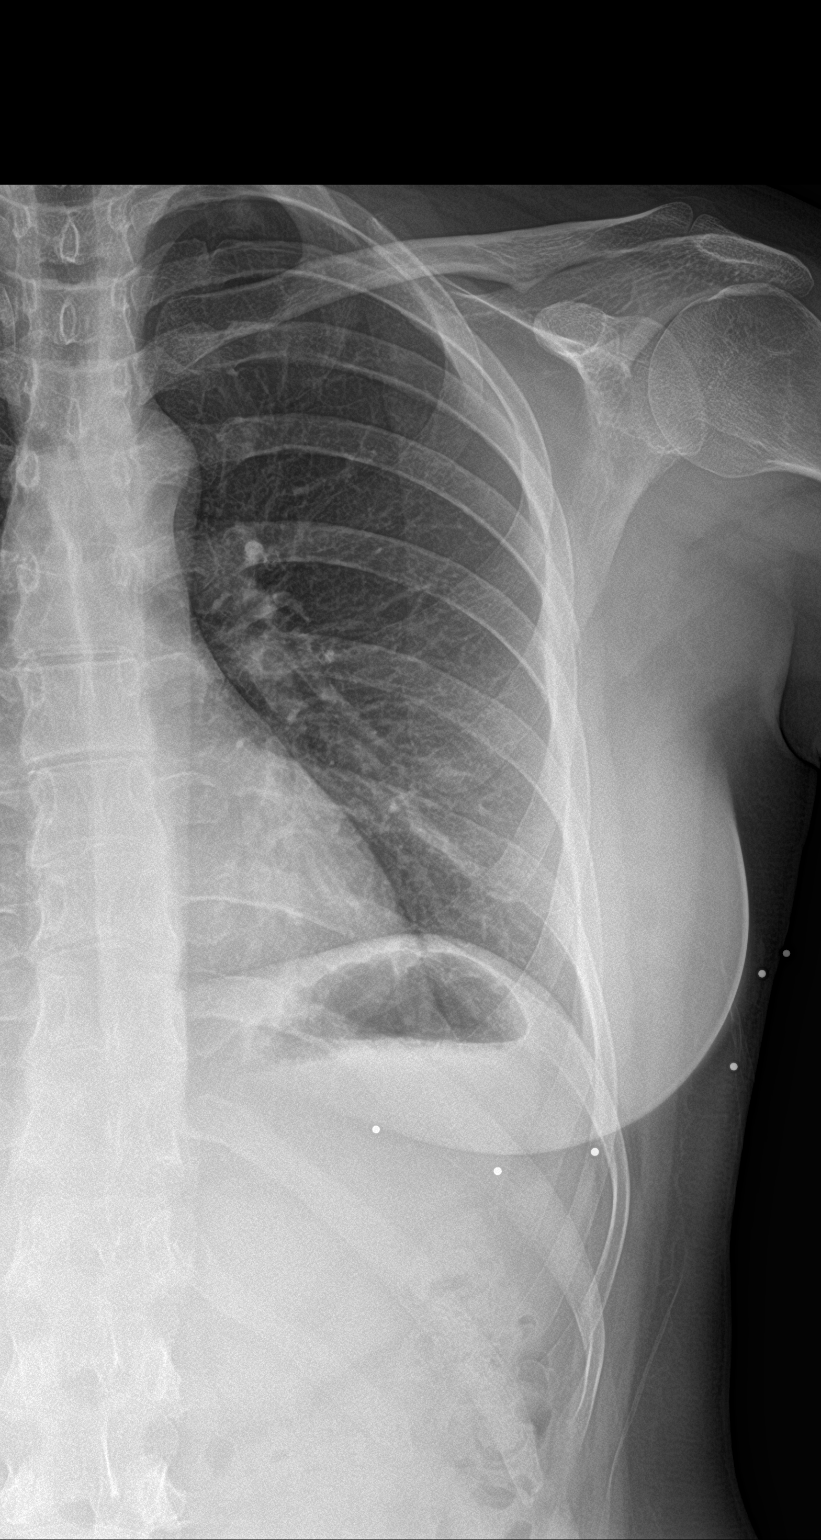

[3 of 3 positions shown; findings below may reference images not displayed]

FINDINGS: No fracture or other bone lesions are seen involving the ribs. There
is no evidence of pneumothorax or pleural effusion. Both lungs are
clear. Heart size and mediastinal contours are within normal limits.
IMPRESSION: Negative.

## 2021-03-04 ENCOUNTER — Other Ambulatory Visit (HOSPITAL_COMMUNITY): Payer: Self-pay | Admitting: Psychiatry

## 2021-03-04 ENCOUNTER — Telehealth (HOSPITAL_COMMUNITY): Payer: Self-pay | Admitting: *Deleted

## 2021-03-04 DIAGNOSIS — F331 Major depressive disorder, recurrent, moderate: Secondary | ICD-10-CM

## 2021-03-04 MED ORDER — QUETIAPINE FUMARATE 100 MG PO TABS: ORAL_TABLET | ORAL | 2 refills | Status: DC

## 2021-03-04 NOTE — Telephone Encounter (Signed)
I called [facility] and they said all of her meds were moved at her request to [facility]. I called Camrin back to tell her to check with her Olivia Le at [facility] there.

## 2021-03-05 ENCOUNTER — Other Ambulatory Visit (HOSPITAL_COMMUNITY): Payer: Self-pay | Admitting: *Deleted

## 2021-03-05 ENCOUNTER — Telehealth (HOSPITAL_COMMUNITY): Payer: Self-pay | Admitting: *Deleted

## 2021-03-05 DIAGNOSIS — F411 Generalized anxiety disorder: Secondary | ICD-10-CM

## 2021-03-05 MED ORDER — GABAPENTIN 300 MG PO CAPS: 300.0000 mg | ORAL_CAPSULE | Freq: Every day | ORAL | 0 refills | Status: DC

## 2021-03-05 MED ORDER — BUSPIRONE HCL 15 MG PO TABS: 15.0000 mg | ORAL_TABLET | Freq: Three times a day (TID) | ORAL | 0 refills | Status: DC

## 2021-03-05 NOTE — Telephone Encounter (Signed)
Call from patient, [facility] where [facility] said I told her all her meds where and the only rx there is her seroquel. I spent time on her meds yesterday and [facility] and [facility] Buspar and Gabapentin are not there, and yesterday HT said they did not have refills or on file meds for her either because [facility]. I spoke with the pharmacist and I called in her Buspar and Gabapentin for 3 months with no refills to match the seroquel rx called in yesterday by her provider.

## 2021-03-05 NOTE — Telephone Encounter (Signed)
Thank you for the update!

## 2021-03-13 ENCOUNTER — Encounter: Payer: Self-pay | Admitting: Endocrinology

## 2021-03-16 ENCOUNTER — Other Ambulatory Visit (HOSPITAL_COMMUNITY): Payer: Self-pay | Admitting: Psychiatry

## 2021-03-16 DIAGNOSIS — F331 Major depressive disorder, recurrent, moderate: Secondary | ICD-10-CM

## 2021-03-27 ENCOUNTER — Other Ambulatory Visit: Payer: Self-pay

## 2021-03-27 ENCOUNTER — Encounter (HOSPITAL_COMMUNITY): Payer: Self-pay | Admitting: Psychiatry

## 2021-03-27 ENCOUNTER — Telehealth (INDEPENDENT_AMBULATORY_CARE_PROVIDER_SITE_OTHER): Payer: No Payment, Other | Admitting: Psychiatry

## 2021-03-27 DIAGNOSIS — F1011 Alcohol abuse, in remission: Secondary | ICD-10-CM | POA: Diagnosis not present

## 2021-03-27 DIAGNOSIS — F411 Generalized anxiety disorder: Secondary | ICD-10-CM

## 2021-03-27 DIAGNOSIS — F331 Major depressive disorder, recurrent, moderate: Secondary | ICD-10-CM | POA: Diagnosis not present

## 2021-03-27 DIAGNOSIS — F900 Attention-deficit hyperactivity disorder, predominantly inattentive type: Secondary | ICD-10-CM | POA: Diagnosis not present

## 2021-03-27 MED ORDER — QUETIAPINE FUMARATE 100 MG PO TABS: ORAL_TABLET | ORAL | 2 refills | Status: DC

## 2021-03-27 MED ORDER — NALTREXONE HCL 50 MG PO TABS: 50.0000 mg | ORAL_TABLET | Freq: Every day | ORAL | 2 refills | Status: DC

## 2021-03-27 MED ORDER — TRAZODONE HCL 50 MG PO TABS: 50.0000 mg | ORAL_TABLET | Freq: Every evening | ORAL | 2 refills | Status: DC | PRN

## 2021-03-27 MED ORDER — GABAPENTIN 300 MG PO CAPS: 300.0000 mg | ORAL_CAPSULE | Freq: Every day | ORAL | 2 refills | Status: DC

## 2021-03-27 MED ORDER — BUPROPION HCL ER (XL) 300 MG PO TB24: 300.0000 mg | ORAL_TABLET | ORAL | 2 refills | Status: DC

## 2021-03-27 MED ORDER — ATOMOXETINE HCL 100 MG PO CAPS: 100.0000 mg | ORAL_CAPSULE | Freq: Every day | ORAL | 2 refills | Status: DC

## 2021-03-27 MED ORDER — ESCITALOPRAM OXALATE 20 MG PO TABS: 20.0000 mg | ORAL_TABLET | Freq: Every day | ORAL | 2 refills | Status: DC

## 2021-03-27 MED ORDER — PROPRANOLOL HCL 10 MG PO TABS: 10.0000 mg | ORAL_TABLET | Freq: Three times a day (TID) | ORAL | 2 refills | Status: DC

## 2021-03-27 MED ORDER — BUSPIRONE HCL 15 MG PO TABS: 15.0000 mg | ORAL_TABLET | Freq: Three times a day (TID) | ORAL | 2 refills | Status: DC

## 2021-03-27 NOTE — Progress Notes (Signed)
BH MD/PA/NP OP Progress Note Virtual Visit via Video Note  I connected with Olivia Le on 03/27/21 at  2:00 PM EDT by a video enabled telemedicine application and verified that I am speaking with the correct person using two identifiers.  Location: Patient: Home Provider: Clinic   I discussed the limitations of evaluation and management by telemedicine and the availability of in person appointments. The patient expressed understanding and agreed to proceed.  I provided 30 minutes of non-face-to-face time during this encounter.    03/27/2021 2:23 PM Olivia Le  MRN:  [id number]  Chief Complaint: "The changes we made with the medication were great".  HPI: 30 year old female seen today for follow-up psychiatric evaluation.  She has a psychiatric history of anxiety, depression, ADHD, alcohol dependence, and SI.  She is managed on propanolol 10 mg 3 times daily, trazodone 50 mg nightly, Strattera 100 mg daily, Seroquel 100 mg nightly, Wellbutrin XL 300 mg, naltrexone 50 mg daily, Lexapro 20 mg daily, and gabapentin 300 mg three times daily.  She notes that her medications are effective in managing her psychiatric conditions.  Today patient was unable to logon virtually so assessment was done over the phone.  During exam she was pleasant, cooperative, and engaged in conversation.  She informed provider that since last visit her medications has been effective in managing her psychiatric conditions.  She notes that she finds the medication regimen great.  She informed Clinical research associate that her anxiety and her depression has diminished since medication adjustments.  She also notes that she has been over 90 days sober from alcohol and is feeling physically better.  Today provider conducted a GAD-7 and patient scored a 10, at her last visit she scored a 13.  Provider also conducted a PHQ-9 and patient scored a 7, at her last visit she scored an 11.  She endorses adequate sleep and appetite.  Today she  denies SI/HI/VAH, mania, or paranoia.    No medication changes made today.  Patient agreeable to continue medication as prescribed.  No other concerns at this time.   Visit Diagnosis:    ICD-10-CM   1. Attention deficit hyperactivity disorder (ADHD), predominantly inattentive type  F90.0 atomoxetine (STRATTERA) 100 MG capsule  2. Moderate episode of recurrent major depressive disorder (HCC)  F33.1 buPROPion (WELLBUTRIN XL) 300 MG 24 hr tablet    escitalopram (LEXAPRO) 20 MG tablet    QUEtiapine (SEROQUEL) 100 MG tablet    traZODone (DESYREL) 50 MG tablet  3. GAD (generalized anxiety disorder)  F41.1 busPIRone (BUSPAR) 15 MG tablet    gabapentin (NEURONTIN) 300 MG capsule    propranolol (INDERAL) 10 MG tablet  4. Alcohol use disorder, mild, in early remission  F10.11 naltrexone (DEPADE) 50 MG tablet    Past Psychiatric History: Anxiety, depression, ADHD, and SI  Past Medical History:  Past Medical History:  Diagnosis Date  . Kidney stone   . Polycystic disease, ovaries     Past Surgical History:  Procedure Laterality Date  . TONSILLECTOMY      Family Psychiatric History: Brother Depression and mother depression  Family History:  Family History  Problem Relation Age of Onset  . Hypertension Mother   . Healthy Father     Social History:  Social History   Socioeconomic History  . Marital status: Single    Spouse name: Not on file  . Number of children: Not on file  . Years of education: Not on file  . Highest education level: Not on file  Occupational History  . Not on file  Tobacco Use  . Smoking status: Never Smoker  . Smokeless tobacco: Never Used  Vaping Use  . Vaping Use: Some days  . Substances: THC, CBD  . Devices: Jule  Substance and Sexual Activity  . Alcohol use: Yes    Comment: weekly  . Drug use: No  . Sexual activity: Not on file  Other Topics Concern  . Not on file  Social History Narrative  . Not on file   Social Determinants of Health    Financial Resource Strain: Not on file  Food Insecurity: Not on file  Transportation Needs: Not on file  Physical Activity: Not on file  Stress: Not on file  Social Connections: Not on file    Allergies: No Known Allergies  Metabolic Disorder Labs: Lab Results  Component Value Date   HGBA1C 4.9 03/29/2020   MPG 93.93 03/29/2020   No results found for: PROLACTIN Lab Results  Component Value Date   CHOL 149 03/29/2020   TRIG 29 03/29/2020   HDL 97 03/29/2020   CHOLHDL 1.5 03/29/2020   VLDL 6 03/29/2020   LDLCALC 46 03/29/2020   Lab Results  Component Value Date   TSH 2.297 03/29/2020    Therapeutic Level Labs: No results found for: LITHIUM No results found for: VALPROATE No components found for:  CBMZ  Current Medications: Current Outpatient Medications  Medication Sig Dispense Refill  . Ascorbic Acid (VITAMIN C WITH ROSE HIPS) 500 MG tablet Take 500 mg by mouth daily.    Marland Kitchen atomoxetine (STRATTERA) 100 MG capsule Take 1 capsule (100 mg total) by mouth daily. 30 capsule 2  . buPROPion (WELLBUTRIN XL) 300 MG 24 hr tablet Take 1 tablet (300 mg total) by mouth every morning. 90 tablet 2  . busPIRone (BUSPAR) 15 MG tablet Take 1 tablet (15 mg total) by mouth 3 (three) times daily. 270 tablet 2  . cholecalciferol (VITAMIN D3) 25 MCG (1000 UNIT) tablet Take 1,000 Units by mouth daily.    Marland Kitchen escitalopram (LEXAPRO) 20 MG tablet Take 1 tablet (20 mg total) by mouth daily. 30 tablet 2  . gabapentin (NEURONTIN) 300 MG capsule Take 1 capsule (300 mg total) by mouth daily. 90 capsule 2  . naltrexone (DEPADE) 50 MG tablet Take 1 tablet (50 mg total) by mouth daily. 30 tablet 2  . propranolol (INDERAL) 10 MG tablet Take 1 tablet (10 mg total) by mouth 3 (three) times daily. 90 tablet 2  . QUEtiapine (SEROQUEL) 100 MG tablet Take one tablet nightly 90 tablet 2  . traZODone (DESYREL) 50 MG tablet Take 1 tablet (50 mg total) by mouth at bedtime as needed. for sleep 30 tablet 2   No  current facility-administered medications for this visit.     Musculoskeletal: Strength & Muscle Tone: Unable to assess due to telephone visit.  Gait & Station: Unable to assess due to telephone visit.  Patient leans: Unable to assess due to telephone visit.   Psychiatric Specialty Exam: Review of Systems  There were no vitals taken for this visit.There is no height or weight on file to calculate BMI.  General Appearance: Unable to assess due to telephone visit.   Eye Contact:  Good  Speech:  Clear and Coherent and Normal Rate  Volume:  Normal  Mood:  Euthymic  Affect:  Congruent  Thought Process:  Coherent, Goal Directed and Linear  Orientation:  Full (Time, Place, and Person)  Thought Content: WDL and Logical  Suicidal Thoughts:  No  Homicidal Thoughts:  No  Memory:  Immediate;   Good Recent;   Good Remote;   Good  Judgement:  Good  Insight:  Good  Psychomotor Activity:  Normal  Concentration:  Concentration: Good and Attention Span: Good  Recall:  Good  Fund of Knowledge: Good  Language: Good  Akathisia:  No  Handed:  Right  AIMS (if indicated): Not done  Assets:  Communication Skills Desire for Improvement Financial Resources/Insurance Housing Social Support  ADL's:  Intact  Cognition: WNL  Sleep:  Good   Screenings: AIMS   Flowsheet Row Ad[facility] (Discharged) [facility]  AIMS Total Score 0    AUDIT   Flowsheet Row Admission (Discharged) from [facility]  Alcohol Use Disorder Identification Test Final Score (AUDIT) 10    GAD-7   Flowsheet Row Video Visit from 03/27/2021 in [facility] Video Visit from 01/02/2021 in [facility] Video Visit from 08/23/2020 in [facility]  Total GAD-7 Score 10 13 9     PHQ2-9   Flowsheet Row Video Visit from  03/27/2021 in [facility] Video Visit from 01/02/2021 in [facility] Video Visit from 08/23/2020 in [facility] Counselor from 06/22/2020 in [place]  PHQ-2 Total Score 1 3 3 4   PHQ-9 Total Score 7 11 12 16     Flowsheet Row Video Visit from 01/02/2021 in [facility] ED from 10/08/2020 in [facility] EMERGENCY DEPARTMENT ED from 06/19/2020 in [facility] EMERGENCY DEPARTMENT  C-SSRS RISK CATEGORY Error: Q7 should not be populated when Q6 is No High Risk Moderate Risk       Assessment and Plan: Patient reports that she is doing well on her current medication regimen.  No medication changes made today.  Patient agreeable to continue medication as scribed.  1. Moderate episode of recurrent major depressive disorder (HCC)  Continue- traZODone (DESYREL) 50 MG tablet; Take 1 tablet (50 mg total) by mouth at bedtime as needed. for sleep  Dispense: 30 tablet; Refill: 2 Continue- escitalopram (LEXAPRO) 20 MG tablet; Take 1 tablet (20 mg total) by mouth daily.  Dispense: 30 tablet; Refill: 2 Continue- buPROPion (WELLBUTRIN XL) 300 MG 24 hr tablet; Take 1 tablet (300 mg total) by mouth every morning.  Dispense: 30 tablet; Refill: 2  2. GAD (generalized anxiety disorder)  Continue- gabapentin (NEURONTIN) 300 MG capsule; Take 1 capsule (300 mg total) by mouth daily.  Dispense: 90 capsule; Refill: 2 Continue- busPIRone (BUSPAR) 15 MG tablet; Take 1 tablet (15 mg total) by mouth 3 (three) times daily.  Dispense: 90 tablet; Refill: 2 Continue- propranolol (INDERAL) 10 MG tablet; Take 1 tablet (10 mg total) by mouth 3 (three) times daily.  Dispense: 90 tablet; Refill: 2  3. Attention deficit hyperactivity disorder (ADHD), predominantly inattentive type  Continue- atomoxetine (STRATTERA) 100 MG capsule; Take 1 capsule (100 mg total) by  mouth daily.  Dispense: 30 capsule; Refill: 2  4. Alcohol use disorder, mild, in early remission  Continue- naltrexone (DEPADE) 50 MG tablet; Take 1 tablet (50 mg total) by mouth daily.  Dispense: 30 tablet; Refill: 2    Follow-up in 3 months Follow-up with therapy   [facility], NP 03/27/2021, 2:23 PM

## 2021-06-12 ENCOUNTER — Other Ambulatory Visit (HOSPITAL_COMMUNITY): Payer: Self-pay | Admitting: Psychiatry

## 2021-06-12 ENCOUNTER — Telehealth (HOSPITAL_COMMUNITY): Payer: Self-pay | Admitting: *Deleted

## 2021-06-12 DIAGNOSIS — F411 Generalized anxiety disorder: Secondary | ICD-10-CM

## 2021-06-12 MED ORDER — BUSPIRONE HCL 15 MG PO TABS: 15.0000 mg | ORAL_TABLET | Freq: Three times a day (TID) | ORAL | 2 refills | Status: DC

## 2021-06-12 MED ORDER — GABAPENTIN 300 MG PO CAPS: 300.0000 mg | ORAL_CAPSULE | Freq: Every day | ORAL | 2 refills | Status: DC

## 2021-06-12 NOTE — Telephone Encounter (Signed)
Patient notes that she has been overly anxious because of life changes.  She notes that recently she found out that she would have to wear a wig for the rest of her life due to potential alopecia/hair loss.  She also notes that she has been sober for 6 months and having a lot of intrusive thoughts about her past life and decisions.  She notes that she missed out on a lot in and regrets a life of substance use.  Patient notes that she has been going to therapy weekly which has been somewhat effective.  She notes that she never picked up her gabapentin or BuSpar from the pharmacy and requested provider refill it today which provider was agreeable to.  Provider sent gabapentin 300 mg 3 times daily and BuSpar 15 mg 3 times daily to preferred pharmacy.  No other concerns noted at this time.

## 2021-06-12 NOTE — Telephone Encounter (Signed)
VM left for writer stating she was seen by Brittney a few months ago, medicine was changed and now she feels she might need one of them back. She would like Brittney to call her so they "can talk thru it"   She has an appt scheduled with Brittney for 06/27/21. Will give the information to Brittney.

## 2021-06-24 ENCOUNTER — Other Ambulatory Visit (HOSPITAL_COMMUNITY): Payer: Self-pay | Admitting: Psychiatry

## 2021-06-24 DIAGNOSIS — F900 Attention-deficit hyperactivity disorder, predominantly inattentive type: Secondary | ICD-10-CM

## 2021-06-24 DIAGNOSIS — F411 Generalized anxiety disorder: Secondary | ICD-10-CM

## 2021-06-24 DIAGNOSIS — F331 Major depressive disorder, recurrent, moderate: Secondary | ICD-10-CM

## 2021-06-27 ENCOUNTER — Encounter (HOSPITAL_COMMUNITY): Payer: Self-pay | Admitting: Psychiatry

## 2021-06-27 ENCOUNTER — Other Ambulatory Visit (HOSPITAL_COMMUNITY): Payer: Self-pay | Admitting: Psychiatry

## 2021-06-27 ENCOUNTER — Telehealth (INDEPENDENT_AMBULATORY_CARE_PROVIDER_SITE_OTHER): Payer: No Payment, Other | Admitting: Psychiatry

## 2021-06-27 DIAGNOSIS — F331 Major depressive disorder, recurrent, moderate: Secondary | ICD-10-CM | POA: Diagnosis not present

## 2021-06-27 DIAGNOSIS — F411 Generalized anxiety disorder: Secondary | ICD-10-CM

## 2021-06-27 DIAGNOSIS — F1011 Alcohol abuse, in remission: Secondary | ICD-10-CM | POA: Diagnosis not present

## 2021-06-27 DIAGNOSIS — F900 Attention-deficit hyperactivity disorder, predominantly inattentive type: Secondary | ICD-10-CM

## 2021-06-27 MED ORDER — GABAPENTIN 300 MG PO CAPS
300.0000 mg | ORAL_CAPSULE | Freq: Every day | ORAL | 2 refills | Status: DC
Start: 2021-06-27 — End: 2021-08-08

## 2021-06-27 MED ORDER — ATOMOXETINE HCL 100 MG PO CAPS: 100.0000 mg | ORAL_CAPSULE | Freq: Every day | ORAL | 3 refills | Status: DC

## 2021-06-27 MED ORDER — BUPROPION HCL ER (XL) 300 MG PO TB24: 300.0000 mg | ORAL_TABLET | ORAL | 3 refills | Status: DC

## 2021-06-27 MED ORDER — PROPRANOLOL HCL 10 MG PO TABS: 10.0000 mg | ORAL_TABLET | Freq: Three times a day (TID) | ORAL | 3 refills | Status: DC

## 2021-06-27 MED ORDER — BUSPIRONE HCL 15 MG PO TABS: 15.0000 mg | ORAL_TABLET | Freq: Three times a day (TID) | ORAL | 3 refills | Status: DC

## 2021-06-27 MED ORDER — NALTREXONE HCL 50 MG PO TABS: 50.0000 mg | ORAL_TABLET | Freq: Every day | ORAL | 3 refills | Status: DC

## 2021-06-27 MED ORDER — TRAZODONE HCL 50 MG PO TABS: 50.0000 mg | ORAL_TABLET | Freq: Every evening | ORAL | 3 refills | Status: DC | PRN

## 2021-06-27 MED ORDER — ESCITALOPRAM OXALATE 20 MG PO TABS: 20.0000 mg | ORAL_TABLET | Freq: Every day | ORAL | 3 refills | Status: DC

## 2021-06-27 MED ORDER — QUETIAPINE FUMARATE 100 MG PO TABS: ORAL_TABLET | ORAL | 3 refills | Status: DC

## 2021-06-27 NOTE — Progress Notes (Signed)
BH MD/PA/NP OP Progress Note Virtual Visit via Telephone Note  I connected with Olivia Le on 06/27/21 at 11:30 AM EDT by telephone and verified that I am speaking with the correct person using two identifiers.  Location: Patient: home Provider: Clinic   I discussed the limitations, risks, security and privacy concerns of performing an evaluation and management service by telephone and the availability of in person appointments. I also discussed with the patient that there may be a patient responsible charge related to this service. The patient expressed understanding and agreed to proceed.   I provided 30 minutes of non-face-to-face time during this encounter.     06/27/2021 11:35 AM Olivia Le  MRN:  [id number]  Chief Complaint: "I'm feeling really good".  HPI: 30 year old female seen today for follow-up psychiatric evaluation.  She has a psychiatric history of anxiety, depression, ADHD, alcohol dependence, and SI.  She is managed on propanolol 10 mg 3 times daily, trazodone 50 mg nightly, Strattera 100 mg daily, Seroquel 100 mg nightly, Wellbutrin XL 300 mg, Buspar 15 mg three times daily, naltrexone 50 mg daily, Lexapro 20 mg daily, and gabapentin 300 mg three times daily.  She notes that her medications are effective in managing her psychiatric conditions.  Today patient was unable to logon virtually so assessment was done over the phone.  During exam she was pleasant, cooperative, and engaged in conversation.  She informed provider that she has been feeling really good.  She notes that she was able to pick up her gabapentin and BuSpar and notes that they are effective in managing her anxiety.  Patient notes she now has minimal anxiety and depression.  Provider conducted a GAD-7 and patient scored a 5, at her last visit she scored a 10.  Provider also conducted a PHQ-9 and patient scored a 3, at her last visit she scored a 7.  She endorses adequate sleep and appetite.  Patient  notes that she is lost a few pounds since her last visit. Today she denies SI/HI/VAH, mania, or paranoia.    No medication changes made today.  Patient agreeable to continue medication as prescribed.  No other concerns at this time.   Visit Diagnosis:    ICD-10-CM   1. Attention deficit hyperactivity disorder (ADHD), predominantly inattentive type  F90.0 atomoxetine (STRATTERA) 100 MG capsule    2. Moderate episode of recurrent major depressive disorder (HCC)  F33.1 buPROPion (WELLBUTRIN XL) 300 MG 24 hr tablet    escitalopram (LEXAPRO) 20 MG tablet    QUEtiapine (SEROQUEL) 100 MG tablet    traZODone (DESYREL) 50 MG tablet    3. GAD (generalized anxiety disorder)  F41.1 busPIRone (BUSPAR) 15 MG tablet    gabapentin (NEURONTIN) 300 MG capsule    propranolol (INDERAL) 10 MG tablet    4. Alcohol use disorder, mild, in early remission  F10.11 naltrexone (DEPADE) 50 MG tablet      Past Psychiatric History: Anxiety, depression, ADHD, and SI  Past Medical History:  Past Medical History:  Diagnosis Date   Kidney stone    Polycystic disease, ovaries     Past Surgical History:  Procedure Laterality Date   TONSILLECTOMY      Family Psychiatric History: Brother Depression and mother depression  Family History:  Family History  Problem Relation Age of Onset   Hypertension Mother    Healthy Father     Social History:  Social History   Socioeconomic History   Marital status: Single    Spouse name: Not  on file   Number of children: Not on file   Years of education: Not on file   Highest education level: Not on file  Occupational History   Not on file  Tobacco Use   Smoking status: Never   Smokeless tobacco: Never  Vaping Use   Vaping Use: Some days   Substances: THC, CBD   Devices: Jule  Substance and Sexual Activity   Alcohol use: Yes    Comment: weekly   Drug use: No   Sexual activity: Not on file  Other Topics Concern   Not on file  Social History Narrative    Not on file   Social Determinants of Health   Financial Resource Strain: Not on file  Food Insecurity: Not on file  Transportation Needs: Not on file  Physical Activity: Not on file  Stress: Not on file  Social Connections: Not on file    Allergies: No Known Allergies  Metabolic Disorder Labs: Lab Results  Component Value Date   HGBA1C 4.9 03/29/2020   MPG 93.93 03/29/2020   No results found for: PROLACTIN Lab Results  Component Value Date   CHOL 149 03/29/2020   TRIG 29 03/29/2020   HDL 97 03/29/2020   CHOLHDL 1.5 03/29/2020   VLDL 6 03/29/2020   LDLCALC 46 03/29/2020   Lab Results  Component Value Date   TSH 2.297 03/29/2020    Therapeutic Level Labs: No results found for: LITHIUM No results found for: VALPROATE No components found for:  CBMZ  Current Medications: Current Outpatient Medications  Medication Sig Dispense Refill   Ascorbic Acid (VITAMIN C WITH ROSE HIPS) 500 MG tablet Take 500 mg by mouth daily.     atomoxetine (STRATTERA) 100 MG capsule Take 1 capsule (100 mg total) by mouth daily. 30 capsule 3   buPROPion (WELLBUTRIN XL) 300 MG 24 hr tablet Take 1 tablet (300 mg total) by mouth every morning. 30 tablet 3   busPIRone (BUSPAR) 15 MG tablet Take 1 tablet (15 mg total) by mouth 3 (three) times daily. 90 tablet 3   cholecalciferol (VITAMIN D3) 25 MCG (1000 UNIT) tablet Take 1,000 Units by mouth daily.     escitalopram (LEXAPRO) 20 MG tablet Take 1 tablet (20 mg total) by mouth daily. 30 tablet 3   gabapentin (NEURONTIN) 300 MG capsule Take 1 capsule (300 mg total) by mouth daily. 90 capsule 2   naltrexone (DEPADE) 50 MG tablet Take 1 tablet (50 mg total) by mouth daily. 30 tablet 3   propranolol (INDERAL) 10 MG tablet Take 1 tablet (10 mg total) by mouth 3 (three) times daily. 90 tablet 3   QUEtiapine (SEROQUEL) 100 MG tablet Take one tablet nightly 30 tablet 3   traZODone (DESYREL) 50 MG tablet Take 1 tablet (50 mg total) by mouth at bedtime as  needed. for sleep 30 tablet 3   No current facility-administered medications for this visit.     Musculoskeletal: Strength & Muscle Tone:  Unable to assess due to telephone visit.  Gait & Station:  Unable to assess due to telephone visit.  Patient leans:  Unable to assess due to telephone visit.   Psychiatric Specialty Exam: Review of Systems  There were no vitals taken for this visit.There is no height or weight on file to calculate BMI.  General Appearance:  Unable to assess due to telephone visit.   Eye Contact:  Good  Speech:  Clear and Coherent and Normal Rate  Volume:  Normal  Mood:  Euthymic  Affect:  Congruent  Thought Process:  Coherent, Goal Directed and Linear  Orientation:  Full (Time, Place, and Person)  Thought Content: WDL and Logical   Suicidal Thoughts:  No  Homicidal Thoughts:  No  Memory:  Immediate;   Good Recent;   Good Remote;   Good  Judgement:  Good  Insight:  Good  Psychomotor Activity:  Normal  Concentration:  Concentration: Good and Attention Span: Good  Recall:  Good  Fund of Knowledge: Good  Language: Good  Akathisia:  No  Handed:  Right  AIMS (if indicated): Not done  Assets:  Communication Skills Desire for Improvement Financial Resources/Insurance Housing Social Support  ADL's:  Intact  Cognition: WNL  Sleep:  Good   Screenings: AIMS    Flowsheet Row Admission (Discharged) [facility]  AIMS Total Score 0      AUDIT    Flowsheet Row Admission (Discharged) from [facility]  Alcohol Use Disorder Identification Test Final Score (AUDIT) 10      GAD-7    Flowsheet Row Video Visit from 06/27/2021 in [facility] Video Visit from 03/27/2021 in [facility] Video Visit from 01/02/2021 in [facility] Video Visit from 08/23/2020  in [facility]  Total GAD-7 Score 5 10 13 9       PHQ2-9    Flowsheet Row Video Visit from 06/27/2021 in [facility] Video Visit from 03/27/2021 in [facility] Video Visit from 01/02/2021 in [facility] Video Visit from 08/23/2020 in [facility] Counselor from 06/22/2020 in [facility]  PHQ-2 Total Score 0 1 3 3 4   PHQ-9 Total Score 3 7 11 12 16       Flowsheet Row Video Visit from 01/02/2021 in [facility] ED from 10/08/2020 in [facility] EMERGENCY DEPARTMENT ED from 06/19/2020 in [facility] EMERGENCY DEPARTMENT  C-SSRS RISK CATEGORY Error: Q7 should not be populated when Q6 is No High Risk Moderate Risk        Assessment and Plan: Patient reports that she is doing well on her current medication regimen.  No medication changes made today.  Patient agreeable to continue medication as scribed.  1. Moderate episode of recurrent major depressive disorder (HCC)  Continue- traZODone (DESYREL) 50 MG tablet; Take 1 tablet (50 mg total) by mouth at bedtime as needed. for sleep  Dispense: 30 tablet; Refill: 3 Continue- escitalopram (LEXAPRO) 20 MG tablet; Take 1 tablet (20 mg total) by mouth daily.  Dispense: 30 tablet; Refill: 3 Continue- buPROPion (WELLBUTRIN XL) 300 MG 24 hr tablet; Take 1 tablet (300 mg total) by mouth every morning.  Dispense: 30 tablet; Refill: 3  2. GAD (generalized anxiety disorder)  Continue- gabapentin (NEURONTIN) 300 MG capsule; Take 1 capsule (300 mg total) by mouth daily.  Dispense: 90 capsule; Refill: 3 Continue- busPIRone (BUSPAR) 15 MG tablet; Take 1 tablet (15 mg total) by mouth 3 (three) times daily.  Dispense: 90 tablet; Refill: 3 Continue- propranolol (INDERAL) 10 MG tablet; Take 1 tablet (10 mg total) by mouth 3 (three) times daily.   Dispense: 90 tablet; Refill: 3 Continue-busPIRone (BUSPAR) 15 MG tablet; Take 1 tablet (15 mg total) by mouth 3 (three) times daily.  Dispense: 90 tablet; Refill: 3  3. Attention deficit hyperactivity disorder (ADHD), predominantly inattentive type  Continue- atomoxetine (STRATTERA) 100 MG capsule; Take 1 capsule (100 mg total) by mouth daily.  Dispense: 30 capsule; Refill: 3 4. Alcohol use disorder, mild, in early remission  Continue- naltrexone (DEPADE) 50 MG tablet; Take 1 tablet (50 mg total) by mouth daily.  Dispense: 30 tablet; Refill: 3      Follow-up in 3 months Follow-up with therapy   Shanna CiscoBrittney E Shabria Egley, NP 06/27/2021, 11:35 AM

## 2021-07-31 ENCOUNTER — Telehealth (HOSPITAL_COMMUNITY): Payer: Self-pay | Admitting: Psychiatry

## 2021-07-31 NOTE — Telephone Encounter (Signed)
Pt requesting return call to discuss a new treatment option. Pt [phone]

## 2021-08-01 ENCOUNTER — Other Ambulatory Visit (HOSPITAL_COMMUNITY): Payer: Self-pay | Admitting: Psychiatry

## 2021-08-01 DIAGNOSIS — F331 Major depressive disorder, recurrent, moderate: Secondary | ICD-10-CM

## 2021-08-05 NOTE — Telephone Encounter (Signed)
Received Provider Referral for Ketamine Infusion Therapy form via email.  Printed and placed in Parson's assistant's office bin.

## 2021-08-06 NOTE — Telephone Encounter (Signed)
Provider attempted to call patient without success.  Provider did leave a voicemail informing her that writer could not sign off on ketamine infusion treatments.  Provider discussed this with attending physician Dr. Lucianne Muss.  Provider informed patient to call the clinic if she has other questions or concerns.

## 2021-08-07 ENCOUNTER — Telehealth (HOSPITAL_COMMUNITY): Payer: Self-pay | Admitting: *Deleted

## 2021-08-07 NOTE — Telephone Encounter (Signed)
VM from patient stating she couldn't pick up her Gabapentin rx because it wasn't written for the right dose. She is saying it should be TID and its currently written for QD. Dr Doyne Keel has gone home for the day will forward patients concern to her and she can address it tomorrow when she is back in the office.

## 2021-08-08 ENCOUNTER — Other Ambulatory Visit (HOSPITAL_COMMUNITY): Payer: Self-pay | Admitting: Psychiatry

## 2021-08-08 DIAGNOSIS — F411 Generalized anxiety disorder: Secondary | ICD-10-CM

## 2021-08-08 MED ORDER — GABAPENTIN 300 MG PO CAPS: 300.0000 mg | ORAL_CAPSULE | Freq: Three times a day (TID) | ORAL | 3 refills | Status: DC

## 2021-08-08 NOTE — Telephone Encounter (Signed)
Medication refilled and sent to preferred pharmacy

## 2021-09-30 ENCOUNTER — Other Ambulatory Visit (HOSPITAL_COMMUNITY): Payer: Self-pay | Admitting: Psychiatry

## 2021-09-30 DIAGNOSIS — F331 Major depressive disorder, recurrent, moderate: Secondary | ICD-10-CM

## 2021-09-30 DIAGNOSIS — F900 Attention-deficit hyperactivity disorder, predominantly inattentive type: Secondary | ICD-10-CM

## 2021-09-30 DIAGNOSIS — F411 Generalized anxiety disorder: Secondary | ICD-10-CM

## 2021-10-02 ENCOUNTER — Encounter (HOSPITAL_COMMUNITY): Payer: Self-pay | Admitting: Psychiatry

## 2021-10-02 ENCOUNTER — Telehealth (INDEPENDENT_AMBULATORY_CARE_PROVIDER_SITE_OTHER): Payer: No Payment, Other | Admitting: Psychiatry

## 2021-10-02 DIAGNOSIS — F331 Major depressive disorder, recurrent, moderate: Secondary | ICD-10-CM

## 2021-10-02 DIAGNOSIS — F411 Generalized anxiety disorder: Secondary | ICD-10-CM

## 2021-10-02 DIAGNOSIS — F900 Attention-deficit hyperactivity disorder, predominantly inattentive type: Secondary | ICD-10-CM

## 2021-10-02 MED ORDER — BUPROPION HCL ER (XL) 300 MG PO TB24
300.0000 mg | ORAL_TABLET | ORAL | 3 refills | Status: DC
Start: 2021-10-02 — End: 2021-12-26

## 2021-10-02 MED ORDER — ATOMOXETINE HCL 100 MG PO CAPS: 100.0000 mg | ORAL_CAPSULE | Freq: Every day | ORAL | 3 refills | Status: DC

## 2021-10-02 MED ORDER — QUETIAPINE FUMARATE 100 MG PO TABS: ORAL_TABLET | ORAL | 3 refills | Status: DC

## 2021-10-02 MED ORDER — TRAZODONE HCL 50 MG PO TABS: 50.0000 mg | ORAL_TABLET | Freq: Every evening | ORAL | 3 refills | Status: DC | PRN

## 2021-10-02 MED ORDER — ESCITALOPRAM OXALATE 20 MG PO TABS: 20.0000 mg | ORAL_TABLET | Freq: Every day | ORAL | 3 refills | Status: DC

## 2021-10-02 MED ORDER — BUSPIRONE HCL 15 MG PO TABS
15.0000 mg | ORAL_TABLET | Freq: Three times a day (TID) | ORAL | 3 refills | Status: DC
Start: 2021-10-02 — End: 2021-12-26

## 2021-10-02 MED ORDER — GABAPENTIN 300 MG PO CAPS: 300.0000 mg | ORAL_CAPSULE | Freq: Three times a day (TID) | ORAL | 3 refills | Status: DC

## 2021-10-02 NOTE — Progress Notes (Signed)
BH MD/PA/NP OP Progress Note Virtual Visit via Telephone Note  I connected with Olivia Le on 10/02/21 at  1:30 PM EST by telephone and verified that I am speaking with the correct person using two identifiers.  Location: Patient: home Provider: Clinic   I discussed the limitations, risks, security and privacy concerns of performing an evaluation and management service by telephone and the availability of in person appointments. I also discussed with the patient that there may be a patient responsible charge related to this service. The patient expressed understanding and agreed to proceed.   I provided 30 minutes of non-face-to-face time during this encounter.     10/02/2021 1:56 PM Olivia Le  MRN:  [id number]  Chief Complaint: "The Strattera works some but at times I can be like a pin ball machine".  HPI: 30 year old female seen today for follow-up psychiatric evaluation.  She has a psychiatric history of anxiety, depression, ADHD, alcohol dependence, and SI.  She is managed on propanolol 10 mg 3 times daily, trazodone 50 mg nightly, Strattera 100 mg daily, Seroquel 100 mg nightly, Wellbutrin XL 300 mg, Buspar 15 mg three times daily, naltrexone 50 mg daily, Lexapro 20 mg daily, and gabapentin 300 mg three times daily.  She informed Clinical research associate that she discontinued naltrexone and propanolol.  She notes her other medications are effective in managing her psychiatric conditions.    Today patient was unable to logon virtually so assessment was done over the phone.  During exam she was pleasant, cooperative, and engaged in conversation.  She informed that her medications overall are effective.  She however compared herself to a pinball machine.  She notes that she finds Strattera effective however notes that at times her mind races from one thing to another.  She does note that she is able to complete tasks, listen more, and is let forgetful.  She notes that her mood continues to be  stable and reports that she has minimal anxiety and depression.  Provider conducted a GAD-7 and patient scored a 9, at her last visit she scored a 5.  Provider also conducted PHQ-9 and patient scored a 4, at her last visit she scored a 3.   She endorses adequate sleep and appetite.   Today she denies SI/HI/VAH, mania, or paranoia.    Patient notes that she has been sober from alcohol for over 9 months.  She notes that she is doing well on her sobriety journey.   At this time she does not want to restart naltrexone or propanolol.  She will continue all other medications as prescribed.     Visit Diagnosis:    ICD-10-CM   1. Moderate episode of recurrent major depressive disorder (HCC)  F33.1 traZODone (DESYREL) 50 MG tablet    QUEtiapine (SEROQUEL) 100 MG tablet    escitalopram (LEXAPRO) 20 MG tablet    buPROPion (WELLBUTRIN XL) 300 MG 24 hr tablet    2. GAD (generalized anxiety disorder)  F41.1 gabapentin (NEURONTIN) 300 MG capsule    busPIRone (BUSPAR) 15 MG tablet    3. Attention deficit hyperactivity disorder (ADHD), predominantly inattentive type  F90.0 atomoxetine (STRATTERA) 100 MG capsule      Past Psychiatric History: Anxiety, depression, ADHD, and SI  Past Medical History:  Past Medical History:  Diagnosis Date   Kidney stone    Polycystic disease, ovaries     Past Surgical History:  Procedure Laterality Date   TONSILLECTOMY      Family Psychiatric History: Brother Depression and mother  depression  Family History:  Family History  Problem Relation Age of Onset   Hypertension Mother    Healthy Father     Social History:  Social History   Socioeconomic History   Marital status: Single    Spouse name: Not on file   Number of children: Not on file   Years of education: Not on file   Highest education level: Not on file  Occupational History   Not on file  Tobacco Use   Smoking status: Never   Smokeless tobacco: Never  Vaping Use   Vaping Use: Some days    Substances: THC, CBD   Devices: Jule  Substance and Sexual Activity   Alcohol use: Yes    Comment: weekly   Drug use: No   Sexual activity: Not on file  Other Topics Concern   Not on file  Social History Narrative   Not on file   Social Determinants of Health   Financial Resource Strain: Not on file  Food Insecurity: Not on file  Transportation Needs: Not on file  Physical Activity: Not on file  Stress: Not on file  Social Connections: Not on file    Allergies: No Known Allergies  Metabolic Disorder Labs: Lab Results  Component Value Date   HGBA1C 4.9 03/29/2020   MPG 93.93 03/29/2020   No results found for: PROLACTIN Lab Results  Component Value Date   CHOL 149 03/29/2020   TRIG 29 03/29/2020   HDL 97 03/29/2020   CHOLHDL 1.5 03/29/2020   VLDL 6 03/29/2020   LDLCALC 46 03/29/2020   Lab Results  Component Value Date   TSH 2.297 03/29/2020    Therapeutic Level Labs: No results found for: LITHIUM No results found for: VALPROATE No components found for:  CBMZ  Current Medications: Current Outpatient Medications  Medication Sig Dispense Refill   Ascorbic Acid (VITAMIN C WITH ROSE HIPS) 500 MG tablet Take 500 mg by mouth daily.     atomoxetine (STRATTERA) 100 MG capsule Take 1 capsule (100 mg total) by mouth daily. 30 capsule 3   buPROPion (WELLBUTRIN XL) 300 MG 24 hr tablet Take 1 tablet (300 mg total) by mouth every morning. 90 tablet 3   busPIRone (BUSPAR) 15 MG tablet Take 1 tablet (15 mg total) by mouth 3 (three) times daily. 90 tablet 3   cholecalciferol (VITAMIN D3) 25 MCG (1000 UNIT) tablet Take 1,000 Units by mouth daily.     escitalopram (LEXAPRO) 20 MG tablet Take 1 tablet (20 mg total) by mouth daily. 30 tablet 3   gabapentin (NEURONTIN) 300 MG capsule Take 1 capsule (300 mg total) by mouth 3 (three) times daily. 90 capsule 3   QUEtiapine (SEROQUEL) 100 MG tablet Take one tablet nightly 30 tablet 3   traZODone (DESYREL) 50 MG tablet Take 1 tablet  (50 mg total) by mouth at bedtime as needed. for sleep 30 tablet 3   No current facility-administered medications for this visit.     Musculoskeletal: Strength & Muscle Tone:  Unable to assess due to telephone visit.  Gait & Station:  Unable to assess due to telephone visit.  Patient leans:  Unable to assess due to telephone visit.   Psychiatric Specialty Exam: Review of Systems  There were no vitals taken for this visit.There is no height or weight on file to calculate BMI.  General Appearance:  Unable to assess due to telephone visit.   Eye Contact:  Good  Speech:  Clear and Coherent and Normal Rate  Volume:  Normal  Mood:  Euthymic  Affect:  Congruent  Thought Process:  Coherent, Goal Directed and Linear  Orientation:  Full (Time, Place, and Person)  Thought Content: WDL and Logical   Suicidal Thoughts:  No  Homicidal Thoughts:  No  Memory:  Immediate;   Good Recent;   Good Remote;   Good  Judgement:  Good  Insight:  Good  Psychomotor Activity:  Normal  Concentration:  Concentration: Good and Attention Span: Good  Recall:  Good  Fund of Knowledge: Good  Language: Good  Akathisia:  No  Handed:  Right  AIMS (if indicated): Not done  Assets:  Communication Skills Desire for Improvement Financial Resources/Insurance Housing Social Support  ADL's:  Intact  Cognition: WNL  Sleep:  Good   Screenings: AIMS    Flowsheet Row [facility]  AIMS Total Score 0      AUDIT    Flowsheet Row Admission (Discharged[facility]  Alcohol Use Disorder Identification Test Final Score (AUDIT) 10      GAD-7    Flowsheet Row Video Visit from 10/02/2021 in [facility] Video Visit from 06/27/2021 in [facility] Video Visit from 03/27/2021 in [facility] Video Visit from 01/02/2021 in [facility] Video Visit from [facility]/23/2020 in [facility]  Total GAD-7 Score 9 5 10 13 9       PHQ2-9    Flowsheet Row Video Visit from 10/02/2021 in [facility] Video Visit from 06/27/2021 in [facility] Video Visit from 03/27/2021 in [facility] Video Visit from 01/02/2021 in [facility] Video Visit from 08/23/2020 in [facility]  PHQ-2 Total Score 1 0 1 3 3   PHQ-9 Total Score 4 3 7 11 12       Flowsheet Row Video Visit from 01/02/2021 in [facility] ED from 10/08/2020 in [facility] EM[facility] ED from 06/19/2020 in [facility] EMERGENCY DEPARTMENT  C-SSRS RISK CATEGORY Error: Q7 should not be populated when Q6 is No High Risk Moderate Risk        Assessment and Plan: Patient reports that she is doing well on her current medication regimen.  At this time she does not want to restart naltrexone or propanolol.  She will continue all other medications as prescribed.    1. Moderate episode of recurrent major depressive disorder (HCC)  Continue- traZODone (DESYREL) 50 MG tablet; Take 1 tablet (50 mg total) by mouth at bedtime as needed. for sleep  Dispense: 30 tablet; Refill: 3 Continue- escitalopram (LEXAPRO) 20 MG tablet; Take 1 tablet (20 mg total) by mouth daily.  Dispense: 30 tablet; Refill: 3 Continue- buPROPion (WELLBUTRIN XL) 300 MG 24 hr tablet; Take 1 tablet (300 mg total) by mouth every morning.  Dispense: 30 tablet; Refill: 3  2. GAD (generalized anxiety disorder)  Continue- gabapentin (NEURONTIN) 300 MG capsule; Take 1 capsule (300 mg total) by mouth daily.  Dispense: 90 capsule; Refill: 3 Continue- busPIRone (BUSPAR) 15 MG tablet; Take 1 tablet (15 mg total) by mouth 3  (three) times daily.  Dispense: 90 tablet; Refill: 3 Continue-busPIRone (BUSPAR) 15 MG tablet; Take 1 tablet (15 mg total) by mouth 3 (three) times daily.  Dispense: 90 tablet; Refill: 3  3. Attention deficit hyperactivity disorder (ADHD), predominantly inattentive type  Continue- atomoxetine (STRATTERA) 100 MG capsule; Take 1 capsule (100 mg total) by mouth daily.  Dispense: 30 capsule; Refill: 3       Follow-up in 3 months Follow-up with therapy   Shanna Cisco, NP 10/02/2021, 1:56 PM

## 2021-11-02 ENCOUNTER — Other Ambulatory Visit (HOSPITAL_COMMUNITY): Payer: Self-pay | Admitting: Psychiatry

## 2021-11-02 DIAGNOSIS — F331 Major depressive disorder, recurrent, moderate: Secondary | ICD-10-CM

## 2021-12-26 ENCOUNTER — Encounter (HOSPITAL_COMMUNITY): Payer: Self-pay | Admitting: Psychiatry

## 2021-12-26 ENCOUNTER — Telehealth (INDEPENDENT_AMBULATORY_CARE_PROVIDER_SITE_OTHER): Payer: No Payment, Other | Admitting: Psychiatry

## 2021-12-26 DIAGNOSIS — F331 Major depressive disorder, recurrent, moderate: Secondary | ICD-10-CM

## 2021-12-26 DIAGNOSIS — F411 Generalized anxiety disorder: Secondary | ICD-10-CM

## 2021-12-26 DIAGNOSIS — F900 Attention-deficit hyperactivity disorder, predominantly inattentive type: Secondary | ICD-10-CM | POA: Diagnosis not present

## 2021-12-26 MED ORDER — GABAPENTIN 300 MG PO CAPS: 300.0000 mg | ORAL_CAPSULE | Freq: Three times a day (TID) | ORAL | 3 refills | Status: DC

## 2021-12-26 MED ORDER — ESCITALOPRAM OXALATE 20 MG PO TABS: 20.0000 mg | ORAL_TABLET | Freq: Every day | ORAL | 3 refills | Status: DC

## 2021-12-26 MED ORDER — TRAZODONE HCL 50 MG PO TABS: ORAL_TABLET | ORAL | 1 refills | Status: DC

## 2021-12-26 MED ORDER — BUPROPION HCL ER (XL) 300 MG PO TB24: 300.0000 mg | ORAL_TABLET | ORAL | 3 refills | Status: DC

## 2021-12-26 MED ORDER — QUETIAPINE FUMARATE 100 MG PO TABS: ORAL_TABLET | ORAL | 3 refills | Status: DC

## 2021-12-26 MED ORDER — ATOMOXETINE HCL 100 MG PO CAPS: 100.0000 mg | ORAL_CAPSULE | Freq: Every day | ORAL | 3 refills | Status: DC

## 2021-12-26 MED ORDER — BUSPIRONE HCL 15 MG PO TABS: 15.0000 mg | ORAL_TABLET | Freq: Three times a day (TID) | ORAL | 3 refills | Status: DC

## 2021-12-26 NOTE — Progress Notes (Signed)
BH MD/PA/NP OP Progress Note Virtual Visit via Telephone Note  I connected with Olivia Le on 12/26/21 at 10:00 AM EST by telephone and verified that I am speaking with the correct person using two identifiers.  Location: Patient: home Provider: Clinic   I discussed the limitations, risks, security and privacy concerns of performing an evaluation and management service by telephone and the availability of in person appointments. I also discussed with the patient that there may be a patient responsible charge related to this service. The patient expressed understanding and agreed to proceed.   I provided 30 minutes of non-face-to-face time during this encounter.     12/26/2021 10:11 AM Olivia Le  MRN:  [id number]  Chief Complaint: "I'll be sober for a year on Tuesday".  HPI: 31 year old female seen today for follow-up psychiatric evaluation.  She has a psychiatric history of anxiety, depression, ADHD, alcohol dependence (sober 1 year), and SI.  She is managed on  trazodone 50 mg nightly, Strattera 100 mg daily, Seroquel 100 mg nightly, Wellbutrin XL 300 mg, Buspar 15 mg three times daily, Lexapro 20 mg daily, and gabapentin 300 mg three times daily.  She notes her medications are effective in managing her psychiatric conditions.  Today patient was unable to logon virtually so assessment was done over the phone.  During exam she was pleasant, cooperative, and engaged in conversation.  She informed that this week Tuesday will be 1 year of being sober.  She notes that she feels good about her accomplishments and reports that her and her family/ friends will celebrate her accomplishments.  Patient notes that her mood is stable and reports that she has minimal anxiety and depression.  Provider conducted a GAD-7 and patient scored a 3, at her last visit she scored a 9.  Provider also conducted PHQ-9 and patient scored a 3, at her last visit she scored a 4. She endorses adequate sleep and  appetite.  Today she denies SI/HI/VAH, mania, or paranoia.    Today patient informed writer that her brother is on Lamictal and reports that it has drastically impacted his life.  She requests to be started on Lamictal.  Provider informed patient that Lamictal generally is used a mood stabilizer in people who has bipolar disorder and informed her that at this time it is not recommended that Lamictal be started.  She endorsed understanding and agreed.  No medication changes made today.  Patient agreeable to continue medications as prescribed.  Patient continues to go to AA weekly.  No other concerns noted at this time.     Visit Diagnosis:    ICD-10-CM   1. Attention deficit hyperactivity disorder (ADHD), predominantly inattentive type  F90.0 atomoxetine (STRATTERA) 100 MG capsule    2. Moderate episode of recurrent major depressive disorder (HCC)  F33.1 buPROPion (WELLBUTRIN XL) 300 MG 24 hr tablet    escitalopram (LEXAPRO) 20 MG tablet    QUEtiapine (SEROQUEL) 100 MG tablet    traZODone (DESYREL) 50 MG tablet    3. GAD (generalized anxiety disorder)  F41.1 busPIRone (BUSPAR) 15 MG tablet    gabapentin (NEURONTIN) 300 MG capsule      Past Psychiatric History: Anxiety, depression, ADHD, and SI  Past Medical History:  Past Medical History:  Diagnosis Date   Kidney stone    Polycystic disease, ovaries     Past Surgical History:  Procedure Laterality Date   TONSILLECTOMY      Family Psychiatric History: Brother Depression and mother depression  Family History:  Family History  Problem Relation Age of Onset   Hypertension Mother    Healthy Father     Social History:  Social History   Socioeconomic History   Marital status: Single    Spouse name: Not on file   Number of children: Not on file   Years of education: Not on file   Highest education level: Not on file  Occupational History   Not on file  Tobacco Use   Smoking status: Never   Smokeless tobacco: Never   Vaping Use   Vaping Use: Some days   Substances: THC, CBD   Devices: Jule  Substance and Sexual Activity   Alcohol use: Yes    Comment: weekly   Drug use: No   Sexual activity: Not on file  Other Topics Concern   Not on file  Social History Narrative   Not on file   Social Determinants of Health   Financial Resource Strain: Not on file  Food Insecurity: Not on file  Transportation Needs: Not on file  Physical Activity: Not on file  Stress: Not on file  Social Connections: Not on file    Allergies: No Known Allergies  Metabolic Disorder Labs: Lab Results  Component Value Date   HGBA1C 4.9 03/29/2020   MPG 93.93 03/29/2020   No results found for: PROLACTIN Lab Results  Component Value Date   CHOL 149 03/29/2020   TRIG 29 03/29/2020   HDL 97 03/29/2020   CHOLHDL 1.5 03/29/2020   VLDL 6 03/29/2020   LDLCALC 46 03/29/2020   Lab Results  Component Value Date   TSH 2.297 03/29/2020    Therapeutic Level Labs: No results found for: LITHIUM No results found for: VALPROATE No components found for:  CBMZ  Current Medications: Current Outpatient Medications  Medication Sig Dispense Refill   Ascorbic Acid (VITAMIN C WITH ROSE HIPS) 500 MG tablet Take 500 mg by mouth daily.     atomoxetine (STRATTERA) 100 MG capsule Take 1 capsule (100 mg total) by mouth daily. 30 capsule 3   buPROPion (WELLBUTRIN XL) 300 MG 24 hr tablet Take 1 tablet (300 mg total) by mouth every morning. 90 tablet 3   busPIRone (BUSPAR) 15 MG tablet Take 1 tablet (15 mg total) by mouth 3 (three) times daily. 90 tablet 3   cholecalciferol (VITAMIN D3) 25 MCG (1000 UNIT) tablet Take 1,000 Units by mouth daily.     escitalopram (LEXAPRO) 20 MG tablet Take 1 tablet (20 mg total) by mouth daily. 30 tablet 3   gabapentin (NEURONTIN) 300 MG capsule Take 1 capsule (300 mg total) by mouth 3 (three) times daily. 90 capsule 3   QUEtiapine (SEROQUEL) 100 MG tablet Take one tablet nightly 30 tablet 3    traZODone (DESYREL) 50 MG tablet TAKE 1 TABLET BY MOUTH EVERY DAY AT BEDTIME AS NEEDED FOR SLEEP 90 tablet 1   No current facility-administered medications for this visit.     Musculoskeletal: Strength & Muscle Tone:  Unable to assess due to telephone visit.  Gait & Station:  Unable to assess due to telephone visit.  Patient leans:  Unable to assess due to telephone visit.   Psychiatric Specialty Exam: Review of Systems  There were no vitals taken for this visit.There is no height or weight on file to calculate BMI.  General Appearance:  Unable to assess due to telephone visit.   Eye Contact:  Good  Speech:  Clear and Coherent and Normal Rate  Volume:  Normal  Mood:  Euthymic  Affect:  Congruent  Thought Process:  Coherent, Goal Directed and Linear  Orientation:  Full (Time, Place, and Person)  Thought Content: WDL and Logical   Suicidal Thoughts:  No  Homicidal Thoughts:  No  Memory:  Immediate;   Good Recent;   Good Remote;   Good  Judgement:  Good  Insight:  Good  Psychomotor Activity:  Normal  Concentration:  Concentration: Good and Attention Span: Good  Recall:  Good  Fund of Knowledge: Good  Language: Good  Akathisia:  No  Handed:  Right  AIMS (if indicated): Not done  Assets:  Communication Skills Desire for Improvement Financial Resources/Insurance Housing Social Support  ADL's:  Intact  Cognition: WNL  Sleep:  Good   Screenings: [place] Admission (Discharged) from OP Visit from 03/28/2020 in [place] [facility]  AIMS Total Score 0      AUDIT    Flowsheet Row Admission (Discharged) from OP Visit from 03/28/2020 in [place] [facility]  Alcohol Use Disorder Identification Test Final Score (AUDIT) 10      GAD-7    Flowsheet Row Video Visit from 12/26/2021 in [facility] Video Visit from 10/02/2021 in [facility] Video Visit from  06/27/2021 in [facility] Video Visit from 03/27/2021 in [facility] Video Visit from 01/02/2021 in [facility]  Total GAD-7 Score 3 9 5 10 13       PHQ2-9    Flowsheet Row Video Visit from 12/26/2021 in [facility] Video Visit from 10/02/2021 in [facility] Video Visit from 06/27/2021 in [facility] Video Visit from 03/27/2021 in [facility] Video Visit from 01/02/2021 in [facility]  PHQ-2 Total Score 1 1 0 1 3  PHQ-9 Total Score 3 4 3 7 11       Flowsheet Row Video Visit from 01/02/2021 in [facility] ED from 10/08/2020 in [place] ED from 06/19/2020 in [place] Error: Q7 should not be populated when Q6 is No High Risk Moderate Risk        Assessment and Plan: Patient reports that she is doing well on her current medication regimen.  She requested to be started on Lamictal as it is drastically helped her brother.  Provider informed patient that Lamictal is not recommended to be started as she does not have bipolar disorder.  No medication changes made today.  Patient agreeable to continue medications as prescribed.  She will also continue attending [place] meetings.  1. Moderate episode of recurrent major depressive disorder (HCC)  Continue- traZODone (DESYREL) 50 MG tablet; Take 1 tablet (50 mg total) by mouth at bedtime as needed. for sleep  Dispense: 30 tablet; Refill: 3 Continue- escitalopram (LEXAPRO) 20 MG tablet; Take 1 tablet (20 mg total) by mouth daily.  Dispense: 30 tablet; Refill: 3 Continue- buPROPion (WELLBUTRIN XL) 300 MG 24 hr tablet; Take 1 tablet (300 mg total) by mouth every morning.  Dispense: 30 tablet; Refill: 3  2. GAD  (generalized anxiety disorder)  Continue- gabapentin (NEURONTIN) 300 MG capsule; Take 1 capsule (300 mg total) by mouth daily.  Dispense: 90 capsule; Refill: 3 Continue- busPIRone (BUSPAR) 15 MG tablet; Take 1 tablet (15 mg total) by mouth 3 (three)  times daily.  Dispense: 90 tablet; Refill: 3 Continue-busPIRone (BUSPAR) 15 MG tablet; Take 1 tablet (15 mg total) by mouth 3 (three) times daily.  Dispense: 90 tablet; Refill: 3  3. Attention deficit hyperactivity disorder (ADHD), predominantly inattentive type  Continue- atomoxetine (STRATTERA) 100 MG capsule; Take 1 capsule (100 mg total) by mouth daily.  Dispense: 30 capsule; Refill: 3       Follow-up in 3 months Follow-up with therapy   Salley Slaughter, NP 12/26/2021, 10:11 AM

## 2021-12-30 ENCOUNTER — Other Ambulatory Visit (HOSPITAL_COMMUNITY): Payer: Self-pay | Admitting: Psychiatry

## 2021-12-30 DIAGNOSIS — F411 Generalized anxiety disorder: Secondary | ICD-10-CM

## 2022-03-12 ENCOUNTER — Encounter (HOSPITAL_COMMUNITY): Payer: Self-pay

## 2022-03-12 ENCOUNTER — Telehealth (HOSPITAL_COMMUNITY): Payer: Commercial Managed Care - HMO | Admitting: Psychiatry

## 2022-03-12 ENCOUNTER — Telehealth (HOSPITAL_COMMUNITY): Payer: No Payment, Other | Admitting: Psychiatry

## 2022-04-21 ENCOUNTER — Telehealth (HOSPITAL_COMMUNITY): Payer: Self-pay | Admitting: *Deleted

## 2022-04-21 ENCOUNTER — Other Ambulatory Visit (HOSPITAL_COMMUNITY): Payer: Self-pay | Admitting: Psychiatry

## 2022-04-21 DIAGNOSIS — F331 Major depressive disorder, recurrent, moderate: Secondary | ICD-10-CM

## 2022-04-21 DIAGNOSIS — F900 Attention-deficit hyperactivity disorder, predominantly inattentive type: Secondary | ICD-10-CM

## 2022-04-21 DIAGNOSIS — F411 Generalized anxiety disorder: Secondary | ICD-10-CM

## 2022-04-21 MED ORDER — GABAPENTIN 300 MG PO CAPS: 300.0000 mg | ORAL_CAPSULE | Freq: Every day | ORAL | 3 refills | Status: DC

## 2022-04-21 MED ORDER — TRAZODONE HCL 50 MG PO TABS: ORAL_TABLET | ORAL | 1 refills | Status: DC

## 2022-04-21 MED ORDER — ESCITALOPRAM OXALATE 20 MG PO TABS: 20.0000 mg | ORAL_TABLET | Freq: Every day | ORAL | 3 refills | Status: DC

## 2022-04-21 MED ORDER — BUPROPION HCL ER (XL) 300 MG PO TB24: 300.0000 mg | ORAL_TABLET | ORAL | 3 refills | Status: DC

## 2022-04-21 MED ORDER — BUSPIRONE HCL 15 MG PO TABS: 15.0000 mg | ORAL_TABLET | Freq: Three times a day (TID) | ORAL | 3 refills | Status: DC

## 2022-04-21 MED ORDER — ATOMOXETINE HCL 100 MG PO CAPS: 100.0000 mg | ORAL_CAPSULE | Freq: Every day | ORAL | 3 refills | Status: DC

## 2022-04-21 MED ORDER — QUETIAPINE FUMARATE 100 MG PO TABS: ORAL_TABLET | ORAL | 3 refills | Status: DC

## 2022-04-21 NOTE — Telephone Encounter (Signed)
All psychiatric medications refilled and sent to [facility] pharmacy on [address].

## 2022-04-21 NOTE — Telephone Encounter (Signed)
PATIENT CALLED STATED HER  APPT ON  "03/12/22 WITH CICELY PENN SHE NEVER RECEIVED A LINK TO [facility] & THEREFORE UNABLE TO HAVE A APPT TO GET MED REFILL" REQUESTING REFILL FOR  (NEURONTIN) 300 MG capsule TAKE ONE CAPSULE BY MOUTH DAILY, Normal  Dispense: 90 capsule   LVM FOR PATIENT TO CALL TO SCHEDULE NEXT APPT.

## 2022-05-22 ENCOUNTER — Ambulatory Visit: Payer: Commercial Managed Care - HMO | Admitting: Adult Health

## 2022-07-22 ENCOUNTER — Other Ambulatory Visit (HOSPITAL_COMMUNITY): Payer: Self-pay | Admitting: Psychiatry

## 2022-07-22 ENCOUNTER — Telehealth (HOSPITAL_COMMUNITY): Payer: Self-pay

## 2022-07-22 DIAGNOSIS — F331 Major depressive disorder, recurrent, moderate: Secondary | ICD-10-CM

## 2022-07-22 DIAGNOSIS — F411 Generalized anxiety disorder: Secondary | ICD-10-CM

## 2022-07-22 MED ORDER — BUSPIRONE HCL 15 MG PO TABS: 15.0000 mg | ORAL_TABLET | Freq: Three times a day (TID) | ORAL | 3 refills | Status: DC

## 2022-07-22 MED ORDER — BUPROPION HCL ER (XL) 300 MG PO TB24: 300.0000 mg | ORAL_TABLET | ORAL | 3 refills | Status: DC

## 2022-07-22 NOTE — Telephone Encounter (Signed)
Medication refilled and sent to preferred pharmacy

## 2022-10-16 ENCOUNTER — Telehealth (HOSPITAL_COMMUNITY): Payer: Self-pay | Admitting: *Deleted

## 2022-10-16 NOTE — Telephone Encounter (Signed)
VM from patient requesting rx for her meds. States she has been calling for several days re this. Reveiwed her chart, not seen since Feb 2023. She has a refill remaining on wellbutrin and Buspar but not the others. Returned her call and left her a message she will have to make an appt to be seen before more meds can be done for her.

## 2022-10-22 ENCOUNTER — Telehealth (INDEPENDENT_AMBULATORY_CARE_PROVIDER_SITE_OTHER): Payer: Self-pay | Admitting: Psychiatry

## 2022-10-22 ENCOUNTER — Other Ambulatory Visit (HOSPITAL_COMMUNITY): Payer: Self-pay | Admitting: Psychiatry

## 2022-10-22 ENCOUNTER — Encounter (HOSPITAL_COMMUNITY): Payer: Self-pay | Admitting: Psychiatry

## 2022-10-22 DIAGNOSIS — F900 Attention-deficit hyperactivity disorder, predominantly inattentive type: Secondary | ICD-10-CM | POA: Diagnosis not present

## 2022-10-22 DIAGNOSIS — F411 Generalized anxiety disorder: Secondary | ICD-10-CM

## 2022-10-22 DIAGNOSIS — F3181 Bipolar II disorder: Secondary | ICD-10-CM

## 2022-10-22 MED ORDER — TRAZODONE HCL 50 MG PO TABS: ORAL_TABLET | ORAL | 2 refills | Status: DC

## 2022-10-22 MED ORDER — QUETIAPINE FUMARATE 100 MG PO TABS: ORAL_TABLET | ORAL | 3 refills | Status: DC

## 2022-10-22 MED ORDER — BUSPIRONE HCL 30 MG PO TABS: 60.0000 mg | ORAL_TABLET | Freq: Three times a day (TID) | ORAL | 3 refills | Status: DC

## 2022-10-22 MED ORDER — LAMOTRIGINE 25 MG PO TABS: 25.0000 mg | ORAL_TABLET | Freq: Every day | ORAL | 2 refills | Status: DC

## 2022-10-22 MED ORDER — ATOMOXETINE HCL 100 MG PO CAPS: 100.0000 mg | ORAL_CAPSULE | Freq: Every day | ORAL | 3 refills | Status: DC

## 2022-10-22 MED ORDER — GABAPENTIN 300 MG PO CAPS: 300.0000 mg | ORAL_CAPSULE | Freq: Every day | ORAL | 3 refills | Status: DC

## 2022-10-22 NOTE — Progress Notes (Signed)
BH MD/PA/NP OP Progress Note Virtual Visit via Telephone Note  I connected with Olivia Le on 10/22/22 at  8:00 AM EST by telephone and verified that I am speaking with the correct person using two identifiers.  Location: Patient: home Provider: Clinic   I discussed the limitations, risks, security and privacy concerns of performing an evaluation and management service by telephone and the availability of in person appointments. I also discussed with the patient that there may be a patient responsible charge related to this service. The patient expressed understanding and agreed to proceed.   I provided 30 minutes of non-face-to-face time during this encounter.     10/22/2022 8:43 AM Olivia Le  MRN:  [id number]  Chief Complaint: "I have been having extreme lows and intrusive thoughts".  HPI: 31 year old female seen today for follow-up psychiatric evaluation.  She has a psychiatric history of anxiety, depression, ADHD, alcohol dependence (sober 1 year), and SI.  She is managed on  trazodone 50 mg nightly, Strattera 100 mg daily, Seroquel 100 mg nightly, Wellbutrin XL 300 mg, Buspar 15 mg three times daily, Lexapro 20 mg daily, and gabapentin 300 mg three times daily.  She informed Clinical research associate that she discontinued Wellbutrin and Lexapro.  She notes that she ran out by her other medications 2 weeks ago.   Today she was well-groomed, pleasant, cooperative, and engaged in conversation.  She informed Clinical research associate that she has been having extreme lows and intrusive thoughts.  Patient also notes that she has been irritable and somewhat distractible.  At times she notes that she feels overwhelmed with life and notes that this exacerbates her depressive episode.  Provider conducted a PHQ-9 and patient scored an 18, at her last visit she scored a 3.  Provider also conducted a GAD-7 and patient scored 18, at her last visit she scored a 3.  She endorses adequate sleep and appetite.  Today she endorses  passive SI but denies wanting to harm herself.  She denies SI/HI/VH, mania, paranoia.    Today patient requested to start Lamictal to help manage her mood. Today patient instructed to take  Lamictal one tablet (25 mg)for two week, increase to two tablets (50 mg) on week 3, increase to 3 tablets (75 mg) on week 5 to help manage mood and depressive episode. She is also agreeable to increase Buspar 15 mg three times daily to 30 mg twice daily to help manage anxiety and depression. She will restart Trazodone 50 mg nightly as needed, gabapentin 300 mg three times daily, strattera 100 mg daily, and seroquel 100 mg nightly. At this time patient does not with to restart Lexapro or Wellbutrin.       Visit Diagnosis:    ICD-10-CM   1. Bipolar 2 disorder, major depressive episode (HCC)  F31.81 QUEtiapine (SEROQUEL) 100 MG tablet    gabapentin (NEURONTIN) 300 MG capsule    traZODone (DESYREL) 50 MG tablet    lamoTRIgine (LAMICTAL) 25 MG tablet    2. GAD (generalized anxiety disorder)  F41.1 busPIRone (BUSPAR) 30 MG tablet    gabapentin (NEURONTIN) 300 MG capsule    traZODone (DESYREL) 50 MG tablet    3. Attention deficit hyperactivity disorder (ADHD), predominantly inattentive type  F90.0 atomoxetine (STRATTERA) 100 MG capsule      Past Psychiatric History: Anxiety, depression, ADHD, and SI  Past Medical History:  Past Medical History:  Diagnosis Date   Kidney stone    Polycystic disease, ovaries     Past Surgical History:  Procedure  Laterality Date   TONSILLECTOMY      Family Psychiatric History: Brother Depression and mother depression  Family History:  Family History  Problem Relation Age of Onset   Hypertension Mother    Healthy Father     Social History:  Social History   Socioeconomic History   Marital status: Single    Spouse name: Not on file   Number of children: Not on file   Years of education: Not on file   Highest education level: Not on file  Occupational  History   Not on file  Tobacco Use   Smoking status: Never   Smokeless tobacco: Never  Vaping Use   Vaping Use: Some days   Substances: THC, CBD   Devices: Jule  Substance and Sexual Activity   Alcohol use: Yes    Comment: weekly   Drug use: No   Sexual activity: Not on file  Other Topics Concern   Not on file  Social History Narrative   Not on file   Social Determinants of Health   Financial Resource Strain: Not on file  Food Insecurity: Not on file  Transportation Needs: Not on file  Physical Activity: Not on file  Stress: Not on file  Social Connections: Not on file    Allergies: No Known Allergies  Metabolic Disorder Labs: Lab Results  Component Value Date   HGBA1C 4.9 03/29/2020   MPG 93.93 03/29/2020   No results found for: "PROLACTIN" Lab Results  Component Value Date   CHOL 149 03/29/2020   TRIG 29 03/29/2020   HDL 97 03/29/2020   CHOLHDL 1.5 03/29/2020   VLDL 6 03/29/2020   LDLCALC 46 03/29/2020   Lab Results  Component Value Date   TSH 2.297 03/29/2020    Therapeutic Level Labs: No results found for: "LITHIUM" No results found for: "VALPROATE" No results found for: "CBMZ"  Current Medications: Current Outpatient Medications  Medication Sig Dispense Refill   lamoTRIgine (LAMICTAL) 25 MG tablet Take 1 tablet (25 mg total) by mouth daily. Take one tablet (25 mg)for two week, increase to two tablets (50 mg) on week 3, increase to 3 tablets (75 mg) on week 5. 90 tablet 2   Ascorbic Acid (VITAMIN C WITH ROSE HIPS) 500 MG tablet Take 500 mg by mouth daily.     atomoxetine (STRATTERA) 100 MG capsule Take 1 capsule (100 mg total) by mouth daily. 30 capsule 3   busPIRone (BUSPAR) 30 MG tablet Take 2 tablets (60 mg total) by mouth 3 (three) times daily. 60 tablet 3   cholecalciferol (VITAMIN D3) 25 MCG (1000 UNIT) tablet Take 1,000 Units by mouth daily.     gabapentin (NEURONTIN) 300 MG capsule Take 1 capsule (300 mg total) by mouth daily. 90 capsule 3    QUEtiapine (SEROQUEL) 100 MG tablet Take one tablet nightly 30 tablet 3   traZODone (DESYREL) 50 MG tablet TAKE 1 TABLET BY MOUTH EVERY DAY AT BEDTIME AS NEEDED FOR SLEEP 30 tablet 2   No current facility-administered medications for this visit.     Musculoskeletal: Strength & Muscle Tone: within normal limits and Telehealth visit Gait & Station: normal, Telehealth visit Patient leans: N/A  Psychiatric Specialty Exam: Review of Systems  There were no vitals taken for this visit.There is no height or weight on file to calculate BMI.  General Appearance: Well Groomed  Eye Contact:  Good  Speech:  Clear and Coherent and Normal Rate  Volume:  Normal  Mood:  Anxious and Depressed  Affect:  Congruent  Thought Process:  Coherent, Goal Directed and Linear  Orientation:  Full (Time, Place, and Person)  Thought Content: WDL and Logical   Suicidal Thoughts:  Yes.  without intent/plan  Homicidal Thoughts:  No  Memory:  Immediate;   Good Recent;   Good Remote;   Good  Judgement:  Good  Insight:  Good  Psychomotor Activity:  Normal  Concentration:  Concentration: Good and Attention Span: Good  Recall:  Good  Fund of Knowledge: Good  Language: Good  Akathisia:  No  Handed:  Right  AIMS (if indicated): Not done  Assets:  Communication Skills Desire for Improvement Financial Resources/Insurance Housing Social Support  ADL's:  Intact  Cognition: WNL  Sleep:  Good   Screenings: AIMS    Flowsheet Row [facility]  AIMS Total Score 0      AUDIT    Flowsheet Row Admission (Discharged[facility]  Alcohol Use Disorder Identification Test Final Score (AUDIT) 10      GAD-7    Flowsheet [facility]  Total GAD-7 Score 16 3 9 5 10       PHQ2-9    Flowsheet Row [facility]  PHQ-2 Total Score 4 1 1  0 1  PHQ-9 Total Score 18 3 4 3 7       Flowsheet Row [facility]  C-SSRS RISK CATEGORY Error: Q7 should not be populated when Q6 is No Error: Q7 should not be populated when Q6 is No High Risk        Assessment and Plan: Patient reports that She has been more irritable, depressed, anxious, and has intrusive thoughts. Patient requested to start Lamictal to help manage her mood. Today patient instructed to take  Lamictal one tablet (25 mg)for two week, increase to two tablets (50 mg) on week 3, increase to 3 tablets (75 mg) on week 5 to help manage mood and depressive episode. She is also agreeable to increase Buspar 15 mg three times daily to 30 mg twice daily to help manage anxiety and depression. She will restart Trazodone 50 mg nightly as needed, gabapentin 300 mg three times daily, strattera 100 mg daily, and seroquel 100 mg nightly. At this time patient does not with to restart Lexapro or Wellbutrin.   1. GAD (generalized anxiety disorder)  Increased- busPIRone (BUSPAR) 30 MG tablet; Take 2 tablets (60 mg total) by mouth 3 (three)  times daily.  Dispense: 60 tablet; Refill: 3 Restart- gabapentin (NEURONTIN) 300 MG capsule; Take 1 capsule (300 mg total) by mouth daily.  Dispense: 90 capsule; Refill: 3 Restart- traZODone (DESYREL) 50 MG tablet; TAKE 1 TABLET BY MOUTH EVERY  DAY AT BEDTIME AS NEEDED FOR SLEEP  Dispense: 30 tablet; Refill: 2  2. Attention deficit hyperactivity disorder (ADHD), predominantly inattentive type  Restart- atomoxetine (STRATTERA) 100 MG capsule; Take 1 capsule (100 mg total) by mouth daily.  Dispense: 30 capsule; Refill: 3  3. Bipolar 2 disorder, major depressive episode (HCC)  Restart- QUEtiapine (SEROQUEL) 100 MG tablet; Take one tablet nightly  Dispense: 30 tablet; Refill: 3 Restart- gabapentin (NEURONTIN) 300 MG capsule; Take 1 capsule (300 mg total) by mouth daily.  Dispense: 90 capsule; Refill: 3 Restart- traZODone (DESYREL) 50 MG tablet; TAKE 1 TABLET BY MOUTH EVERY DAY AT BEDTIME AS NEEDED FOR SLEEP  Dispense: 30 tablet; Refill: 2 Restart- lamoTRIgine (LAMICTAL) 25 MG tablet; Take 1 tablet (25 mg total) by mouth daily. Take one tablet (25 mg)for two week, increase to two tablets (50 mg) on week 3, increase to 3 tablets (75 mg) on week 5.  Dispense: 90 tablet; Refill: 2       Follow-up in 3 months Follow-up with therapy   Shanna Cisco, NP 10/22/2022, 8:43 AM

## 2022-10-24 ENCOUNTER — Other Ambulatory Visit (HOSPITAL_COMMUNITY): Payer: Self-pay | Admitting: Psychiatry

## 2022-10-24 DIAGNOSIS — F411 Generalized anxiety disorder: Secondary | ICD-10-CM

## 2022-11-07 ENCOUNTER — Telehealth (HOSPITAL_COMMUNITY): Payer: Self-pay | Admitting: *Deleted

## 2022-11-07 NOTE — Telephone Encounter (Signed)
Patient called stated that her script for  gabapentin (NEURONTIN) 300 MG capsule  was sent in as take  1 tab daily not as she normally takes   1 tab 3 x daily

## 2022-11-10 ENCOUNTER — Other Ambulatory Visit (HOSPITAL_COMMUNITY): Payer: Self-pay | Admitting: Psychiatry

## 2022-11-10 DIAGNOSIS — F3181 Bipolar II disorder: Secondary | ICD-10-CM

## 2022-11-10 DIAGNOSIS — F411 Generalized anxiety disorder: Secondary | ICD-10-CM

## 2022-11-10 MED ORDER — GABAPENTIN 300 MG PO CAPS: 300.0000 mg | ORAL_CAPSULE | Freq: Three times a day (TID) | ORAL | 3 refills | Status: DC

## 2022-11-10 NOTE — Telephone Encounter (Signed)
Medication refilled and sent to preferred pharmacy

## 2022-11-13 ENCOUNTER — Other Ambulatory Visit (HOSPITAL_COMMUNITY): Payer: Self-pay | Admitting: Psychiatry

## 2022-11-13 DIAGNOSIS — F411 Generalized anxiety disorder: Secondary | ICD-10-CM

## 2022-11-13 DIAGNOSIS — F900 Attention-deficit hyperactivity disorder, predominantly inattentive type: Secondary | ICD-10-CM

## 2022-11-13 DIAGNOSIS — F3181 Bipolar II disorder: Secondary | ICD-10-CM

## 2022-12-03 ENCOUNTER — Encounter (HOSPITAL_COMMUNITY): Payer: Self-pay | Admitting: Psychiatry

## 2022-12-03 ENCOUNTER — Telehealth (INDEPENDENT_AMBULATORY_CARE_PROVIDER_SITE_OTHER): Payer: Commercial Managed Care - HMO | Admitting: Psychiatry

## 2022-12-03 DIAGNOSIS — F3181 Bipolar II disorder: Secondary | ICD-10-CM | POA: Diagnosis not present

## 2022-12-03 DIAGNOSIS — F411 Generalized anxiety disorder: Secondary | ICD-10-CM | POA: Diagnosis not present

## 2022-12-03 DIAGNOSIS — F900 Attention-deficit hyperactivity disorder, predominantly inattentive type: Secondary | ICD-10-CM

## 2022-12-03 MED ORDER — ATOMOXETINE HCL 100 MG PO CAPS: 100.0000 mg | ORAL_CAPSULE | Freq: Every day | ORAL | 1 refills | Status: DC

## 2022-12-03 MED ORDER — QUETIAPINE FUMARATE 100 MG PO TABS: ORAL_TABLET | ORAL | 1 refills | Status: DC

## 2022-12-03 MED ORDER — BUSPIRONE HCL 30 MG PO TABS: 60.0000 mg | ORAL_TABLET | Freq: Three times a day (TID) | ORAL | 1 refills | Status: DC

## 2022-12-03 MED ORDER — LAMOTRIGINE 100 MG PO TABS: 100.0000 mg | ORAL_TABLET | Freq: Every day | ORAL | 1 refills | Status: DC

## 2022-12-03 MED ORDER — TRAZODONE HCL 50 MG PO TABS: ORAL_TABLET | ORAL | 1 refills | Status: DC

## 2022-12-03 MED ORDER — GABAPENTIN 300 MG PO CAPS: 300.0000 mg | ORAL_CAPSULE | Freq: Three times a day (TID) | ORAL | 1 refills | Status: DC

## 2022-12-03 MED ORDER — BUPROPION HCL ER (XL) 150 MG PO TB24: 150.0000 mg | ORAL_TABLET | ORAL | 1 refills | Status: DC

## 2022-12-03 NOTE — Progress Notes (Signed)
BH MD/PA/NP OP Progress Note Virtual Visit via Video Note  I connected with Olivia Le on 12/03/22 at  8:30 AM EST by a video enabled telemedicine application and verified that I am speaking with the correct person using two identifiers.  Location: Patient: Home Provider: Clinic   I discussed the limitations of evaluation and management by telemedicine and the availability of in person appointments. The patient expressed understanding and agreed to proceed.  I provided 30 minutes of non-face-to-face time during this encounter.       12/03/2022 8:51 AM Olivia Le  MRN:  [id number]  Chief Complaint: "My ADHD is not doing well".  HPI: 32 year old female seen today for follow-up psychiatric evaluation.  She has a psychiatric history of anxiety, depression, ADHD, alcohol dependence (sober 1 year), and SI.  She is managed on  trazodone 50 mg nightly, Lamictal 75 mg, Strattera 100 mg daily, Seroquel 100 mg nightly, Buspar 30 mg twice daily, and gabapentin 300 mg three times daily.  She informed Probation officer that her medications are somewhat effective in managing her psychiatric conditions.  Today she was well-groomed, pleasant, cooperative, and engaged in conversation.  She informed Probation officer that her ADHD is not been doing well.  He notes that she lacks motivation to do activities, avoid tasks that are mentally taxing, and at times is disorganized.  Provider informed patient that while on Wellbutrin she did not present with the symptoms and recommended that she restart it.  She was agreeable to this.    Patient informed Probation officer that since starting Lamictal she no longer has highs, lows, or intrusive thinking.  She reports that she finds her mood stable and notes that her anxiety and depression has improved.  Today provider conducted a GAD7 and patient scored a 9, at her last visit she scored a 16.  Provider also conducted a PHQ-9 and patient scored a 10, at her last visit she scored an 18.   Today she denies SI/HI/VAH, mania, paranoia.    Today Lamictal increased from 75 mg to 100 mg to help manage mood.  She will restart Wellbutrin 150 mg daily to help manage symptoms of ADHD.  No other concerns noted at this time.      Visit Diagnosis:    ICD-10-CM   1. Attention deficit hyperactivity disorder (ADHD), predominantly inattentive type  F90.0 buPROPion (WELLBUTRIN XL) 150 MG 24 hr tablet    atomoxetine (STRATTERA) 100 MG capsule    2. GAD (generalized anxiety disorder)  F41.1 busPIRone (BUSPAR) 30 MG tablet    gabapentin (NEURONTIN) 300 MG capsule    traZODone (DESYREL) 50 MG tablet    3. Bipolar 2 disorder, major depressive episode (HCC)  F31.81 gabapentin (NEURONTIN) 300 MG capsule    lamoTRIgine (LAMICTAL) 100 MG tablet    QUEtiapine (SEROQUEL) 100 MG tablet    traZODone (DESYREL) 50 MG tablet      Past Psychiatric History: Anxiety, depression, ADHD, and SI  Past Medical History:  Past Medical History:  Diagnosis Date   Kidney stone    Polycystic disease, ovaries     Past Surgical History:  Procedure Laterality Date   TONSILLECTOMY      Family Psychiatric History: Brother Depression and mother depression  Family History:  Family History  Problem Relation Age of Onset   Hypertension Mother    Healthy Father     Social History:  Social History   Socioeconomic History   Marital status: Single    Spouse name: Not on file  Number of children: Not on file   Years of education: Not on file   Highest education level: Not on file  Occupational History   Not on file  Tobacco Use   Smoking status: Never   Smokeless tobacco: Never  Vaping Use   Vaping Use: Some days   Substances: THC, CBD   Devices: Jule  Substance and Sexual Activity   Alcohol use: Yes    Comment: weekly   Drug use: No   Sexual activity: Not on file  Other Topics Concern   Not on file  Social History Narrative   Not on file   Social Determinants of Health   Financial  Resource Strain: Not on file  Food Insecurity: Not on file  Transportation Needs: Not on file  Physical Activity: Not on file  Stress: Not on file  Social Connections: Not on file    Allergies: No Known Allergies  Metabolic Disorder Labs: Lab Results  Component Value Date   HGBA1C 4.9 03/29/2020   MPG 93.93 03/29/2020   No results found for: "PROLACTIN" Lab Results  Component Value Date   CHOL 149 03/29/2020   TRIG 29 03/29/2020   HDL 97 03/29/2020   CHOLHDL 1.5 03/29/2020   VLDL 6 03/29/2020   LDLCALC 46 03/29/2020   Lab Results  Component Value Date   TSH 2.297 03/29/2020    Therapeutic Level Labs: No results found for: "LITHIUM" No results found for: "VALPROATE" No results found for: "CBMZ"  Current Medications: Current Outpatient Medications  Medication Sig Dispense Refill   buPROPion (WELLBUTRIN XL) 150 MG 24 hr tablet Take 1 tablet (150 mg total) by mouth every morning. 90 tablet 1   Ascorbic Acid (VITAMIN C WITH ROSE HIPS) 500 MG tablet Take 500 mg by mouth daily.     atomoxetine (STRATTERA) 100 MG capsule Take 1 capsule (100 mg total) by mouth daily. 90 capsule 1   busPIRone (BUSPAR) 30 MG tablet Take 2 tablets (60 mg total) by mouth 3 (three) times daily. 90 tablet 1   cholecalciferol (VITAMIN D3) 25 MCG (1000 UNIT) tablet Take 1,000 Units by mouth daily.     gabapentin (NEURONTIN) 300 MG capsule Take 1 capsule (300 mg total) by mouth 3 (three) times daily. 270 capsule 1   lamoTRIgine (LAMICTAL) 100 MG tablet Take 1 tablet (100 mg total) by mouth daily. Take one tablet (25 mg)for two week, increase to two tablets (50 mg) on week 3, increase to 3 tablets (75 mg) on week 5. 90 tablet 1   QUEtiapine (SEROQUEL) 100 MG tablet TAKE 1 TABLET BY MOUTH EVERY DAY AT NIGHT 90 tablet 1   traZODone (DESYREL) 50 MG tablet TAKE 1 TABLET BY MOUTH AT BEDTIME AS NEEDED FOR SLEEP 90 tablet 1   No current facility-administered medications for this visit.      Musculoskeletal: Strength & Muscle Tone: within normal limits and Telehealth visit Gait & Station: normal, Telehealth visit Patient leans: N/A  Psychiatric Specialty Exam: Review of Systems  There were no vitals taken for this visit.There is no height or weight on file to calculate BMI.  General Appearance: Well Groomed  Eye Contact:  Good  Speech:  Clear and Coherent and Normal Rate  Volume:  Normal  Mood:  Euthymic  Affect:  Congruent  Thought Process:  Coherent, Goal Directed and Linear  Orientation:  Full (Time, Place, and Person)  Thought Content: WDL and Logical   Suicidal Thoughts:  No  Homicidal Thoughts:  No  Memory:  Immediate;   Good Recent;   Good Remote;   Good  Judgement:  Good  Insight:  Good  Psychomotor Activity:  Normal  Concentration:  Concentration: Good and Attention Span: Good  Recall:  Good  Fund of Knowledge: Good  Language: Good  Akathisia:  No  Handed:  Right  AIMS (if indicated): Not done  Assets:  Communication Skills Desire for Improvement Financial Resources/Insurance Housing Social Support  ADL's:  Intact  Cognition: WNL  Sleep:  Good   Screenings: [place] Admission (Discharged) from OP Visit from 03/28/2020 in [place] [facility]  AIMS Total Score 0      AUDIT    Flowsheet Row Admission (Discharged) from OP Visit from 03/28/2020 in [place] [facility]  Alcohol Use Disorder Identification Test Final Score (AUDIT) 10      GAD-7    Flowsheet Row Video Visit from 12/03/2022 in [facility] Video Visit from 10/22/2022 in [facility] Video Visit from 12/26/2021 in [facility] Video Visit from 10/02/2021 in [facility] Video Visit from 06/27/2021 in [facility]  Total GAD-7 Score 9 16 3 9 5       PHQ2-9    Flowsheet Row  Video Visit from 12/03/2022 in [facility] Video Visit from 10/22/2022 in [facility] Video Visit from 12/26/2021 in [facility] Video Visit from 10/02/2021 in [facility] Video Visit from 06/27/2021 in [place]  PHQ-2 Total Score 3 4 1 1  0  PHQ-9 Total Score 10 18 3 4 3       Flowsheet Row Video Visit from 10/22/2022 in [facility] Video Visit from 01/02/2021 in [facility] ED from 10/08/2020 in [facility] Emergency Department at [place] Error: Q7 should not be populated when Q6 is No Error: Q7 should not be populated when Q6 is No High Risk        Assessment and Plan: Patient reports that her anxiety, depression, and mood has improved since her last visit.  She however reports that she continues to struggle with symptoms of ADHD.Today Lamictal increased from 75 mg to 100 mg to help manage mood.  She will restart Wellbutrin 150 mg daily to help manage symptoms of ADHD.   1. Attention deficit hyperactivity disorder (ADHD), predominantly inattentive type  Restart- buPROPion (WELLBUTRIN XL) 150 MG 24 hr tablet; Take 1 tablet (150 mg total) by mouth every morning.  Dispense: 90 tablet; Refill: 1 Continue- atomoxetine (STRATTERA) 100 MG capsule; Take 1 capsule (100 mg total) by mouth daily.  Dispense: 90 capsule; Refill: 1  2. GAD (generalized anxiety disorder)  Continue- busPIRone (BUSPAR) 30 MG tablet; Take 2 tablets (60 mg total) by mouth 3 (three) times daily.  Dispense: 90 tablet; Refill: 1 Continue- gabapentin (NEURONTIN) 300 MG capsule; Take 1 capsule (300 mg total) by mouth 3 (three) times daily.  Dispense: 270 capsule; Refill: 1 Continue- traZODone (DESYREL) 50 MG tablet; TAKE 1 TABLET BY MOUTH AT BEDTIME AS NEEDED FOR SLEEP  Dispense: 90 tablet; Refill:  1  3. Bipolar 2 disorder, major depressive episode (HCC)  Continue- gabapentin (NEURONTIN) 300 MG capsule; Take 1 capsule (300 mg total) by mouth 3 (three) times daily.  Dispense: 270 capsule; Refill: 1 Increased- lamoTRIgine (LAMICTAL) 100  MG tablet; Take 1 tablet (100 mg total) by mouth daily. Take one tablet (25 mg)for two week, increase to two tablets (50 mg) on week 3, increase to 3 tablets (75 mg) on week 5.  Dispense: 90 tablet; Refill: 1 Continue- QUEtiapine (SEROQUEL) 100 MG tablet; TAKE 1 TABLET BY MOUTH EVERY DAY AT NIGHT  Dispense: 90 tablet; Refill: 1 Continue- traZODone (DESYREL) 50 MG tablet; TAKE 1 TABLET BY MOUTH AT BEDTIME AS NEEDED FOR SLEEP  Dispense: 90 tablet; Refill: 1        Follow-up in 3 months Follow-up with therapy   Salley Slaughter, NP 12/03/2022, 8:51 AM

## 2023-02-11 ENCOUNTER — Other Ambulatory Visit (HOSPITAL_COMMUNITY): Payer: Self-pay | Admitting: Psychiatry

## 2023-02-11 ENCOUNTER — Encounter (HOSPITAL_COMMUNITY): Payer: Self-pay | Admitting: Psychiatry

## 2023-02-11 ENCOUNTER — Telehealth (INDEPENDENT_AMBULATORY_CARE_PROVIDER_SITE_OTHER): Payer: Commercial Managed Care - HMO | Admitting: Psychiatry

## 2023-02-11 DIAGNOSIS — F900 Attention-deficit hyperactivity disorder, predominantly inattentive type: Secondary | ICD-10-CM

## 2023-02-11 DIAGNOSIS — F411 Generalized anxiety disorder: Secondary | ICD-10-CM | POA: Diagnosis not present

## 2023-02-11 DIAGNOSIS — F3181 Bipolar II disorder: Secondary | ICD-10-CM | POA: Diagnosis not present

## 2023-02-11 MED ORDER — QUETIAPINE FUMARATE 100 MG PO TABS: ORAL_TABLET | ORAL | 1 refills | Status: DC

## 2023-02-11 MED ORDER — LAMOTRIGINE 100 MG PO TABS: 100.0000 mg | ORAL_TABLET | Freq: Every day | ORAL | 1 refills | Status: DC

## 2023-02-11 MED ORDER — BUSPIRONE HCL 30 MG PO TABS: 60.0000 mg | ORAL_TABLET | Freq: Three times a day (TID) | ORAL | 1 refills | Status: DC

## 2023-02-11 MED ORDER — VILOXAZINE HCL ER 200 MG PO CP24: 400.0000 mg | ORAL_CAPSULE | Freq: Every day | ORAL | 3 refills | Status: DC

## 2023-02-11 MED ORDER — GABAPENTIN 300 MG PO CAPS: 300.0000 mg | ORAL_CAPSULE | Freq: Three times a day (TID) | ORAL | 1 refills | Status: DC

## 2023-02-11 MED ORDER — TRAZODONE HCL 50 MG PO TABS
ORAL_TABLET | ORAL | 1 refills | Status: DC
Start: 2023-02-11 — End: 2023-10-15

## 2023-02-11 MED ORDER — BUPROPION HCL ER (XL) 150 MG PO TB24: 150.0000 mg | ORAL_TABLET | ORAL | 1 refills | Status: DC

## 2023-02-11 NOTE — Progress Notes (Signed)
BH MD/PA/NP OP Progress Note Virtual Visit via Video Note  I connected with Olivia Le on 02/11/23 at  9:30 AM EDT by a video enabled telemedicine application and verified that I am speaking with the correct person using two identifiers.  Location: Patient: Home Provider: Clinic   I discussed the limitations of evaluation and management by telemedicine and the availability of in person appointments. The patient expressed understanding and agreed to proceed.  I provided 30 minutes of non-face-to-face time during this encounter.       02/11/2023 9:56 AM Olivia Le  MRN:  [id number]  Chief Complaint: "My ADHD is still problematic".  HPI: 32 year old female seen today for follow-up psychiatric evaluation.  She has a psychiatric history of anxiety, depression, bipolar disorder, ADHD, alcohol dependence (sober 1 year), and SI.  She is managed on  trazodone 50 mg nightly, Lamictal 100 mg, Strattera 100 mg daily, Seroquel 100 mg nightly, Buspar 30 mg twice daily, and gabapentin 300 mg three times daily.  She informed Clinical research associate that her medications are somewhat effective in managing her psychiatric conditions.  Today she was well-groomed, pleasant, cooperative, and engaged in conversation.  She informed Clinical research associate that her ADHD continues to be problematic.  She informed Clinical research associate that she avoid tasks that are mentally taxing, is forgetful and at times is disorganized.  She informed Clinical research associate that she likes her energy level since being on Wellbutrin but notes that it is not effectively managing her ADHD.  She also informed writer that her [name] is no longer effective.  Provider informed patient that [name] could be trialed.  She endorsed understanding and agreed.  Provider informed patient that if [name] is ineffective a stimulant could be trialed but informed patient that a UDS but have to be taken.  She endorsed understanding.  Patient informed writer that her mood continues to be stable.  She  also reports that her anxiety and depression are well-managed.  Provider conducted a GAD-7 of patient scored a 6, at her last visit she scored a 9.  Provider also conducted PHQ-9 patient scored a 10, at her last visit she scored a 10.  She endorses reduced appetite and a 10 pound weight loss.  Today she denies SI/HI/VAH, mania, paranoia.    Patient informed Clinical research associate that she continues to work at [employer] and notes she finds enjoyment in her job.  Today patient agreeable to start Qelbree 200 mg for a week and then increase to 400 mg  daily to help manage symptoms of ADHD.  At this time Strattera discontinued.  She will continue all other medications as prescribed. Potential side effects of medication and risks vs benefits of treatment vs non-treatment were explained and discussed. All questions were answered. No other concerns noted at this time.      Visit Diagnosis:    ICD-10-CM   1. Attention deficit hyperactivity disorder (ADHD), predominantly inattentive type  F90.0 viloxazine ER (QELBREE) 200 MG 24 hr capsule    buPROPion (WELLBUTRIN XL) 150 MG 24 hr tablet    2. GAD (generalized anxiety disorder)  F41.1 busPIRone (BUSPAR) 30 MG tablet    gabapentin (NEURONTIN) 300 MG capsule    traZODone (DESYREL) 50 MG tablet    3. Bipolar 2 disorder, major depressive episode  F31.81 gabapentin (NEURONTIN) 300 MG capsule    lamoTRIgine (LAMICTAL) 100 MG tablet    QUEtiapine (SEROQUEL) 100 MG tablet    traZODone (DESYREL) 50 MG tablet      Past Psychiatric History: Anxiety,  depression, ADHD, and SI  Past Medical History:  Past Medical History:  Diagnosis Date   Kidney stone    Polycystic disease, ovaries     Past Surgical History:  Procedure Laterality Date   TONSILLECTOMY      Family Psychiatric History: Brother Depression and mother depression  Family History:  Family History  Problem Relation Age of Onset   Hypertension Mother    Healthy Father     Social History:   Social History   Socioeconomic History   Marital status: Single    Spouse name: Not on file   Number of children: Not on file   Years of education: Not on file   Highest education level: Not on file  Occupational History   Not on file  Tobacco Use   Smoking status: Never   Smokeless tobacco: Never  Vaping Use   Vaping Use: Some days   Substances: THC, CBD   Devices: Jule  Substance and Sexual Activity   Alcohol use: Yes    Comment: weekly   Drug use: No   Sexual activity: Not on file  Other Topics Concern   Not on file  Social History Narrative   Not on file   Social Determinants of Health   Financial Resource Strain: Not on file  Food Insecurity: Not on file  Transportation Needs: Not on file  Physical Activity: Not on file  Stress: Not on file  Social Connections: Not on file    Allergies: No Known Allergies  Metabolic Disorder Labs: Lab Results  Component Value Date   HGBA1C 4.9 03/29/2020   MPG 93.93 03/29/2020   No results found for: "PROLACTIN" Lab Results  Component Value Date   CHOL 149 03/29/2020   TRIG 29 03/29/2020   HDL 97 03/29/2020   CHOLHDL 1.5 03/29/2020   VLDL 6 03/29/2020   LDLCALC 46 03/29/2020   Lab Results  Component Value Date   TSH 2.297 03/29/2020    Therapeutic Level Labs: No results found for: "LITHIUM" No results found for: "VALPROATE" No results found for: "CBMZ"  Current Medications: Current Outpatient Medications  Medication Sig Dispense Refill   viloxazine ER (QELBREE) 200 MG 24 hr capsule Take 2 capsules (400 mg total) by mouth daily. 60 capsule 3   Ascorbic Acid (VITAMIN C WITH ROSE HIPS) 500 MG tablet Take 500 mg by mouth daily.     buPROPion (WELLBUTRIN XL) 150 MG 24 hr tablet Take 1 tablet (150 mg total) by mouth every morning. 90 tablet 1   busPIRone (BUSPAR) 30 MG tablet Take 2 tablets (60 mg total) by mouth 3 (three) times daily. 90 tablet 1   cholecalciferol (VITAMIN D3) 25 MCG (1000 UNIT) tablet Take  1,000 Units by mouth daily.     gabapentin (NEURONTIN) 300 MG capsule Take 1 capsule (300 mg total) by mouth 3 (three) times daily. 270 capsule 1   lamoTRIgine (LAMICTAL) 100 MG tablet Take 1 tablet (100 mg total) by mouth daily. Take one tablet (25 mg)for two week, increase to two tablets (50 mg) on week 3, increase to 3 tablets (75 mg) on week 5. 90 tablet 1   QUEtiapine (SEROQUEL) 100 MG tablet TAKE 1 TABLET BY MOUTH EVERY DAY AT NIGHT 90 tablet 1   traZODone (DESYREL) 50 MG tablet TAKE 1 TABLET BY MOUTH AT BEDTIME AS NEEDED FOR SLEEP 90 tablet 1   No current facility-administered medications for this visit.     Musculoskeletal: Strength & Muscle Tone: within normal limits and  Telehealth visit Gait & Station: normal, Telehealth visit Patient leans: N/A  Psychiatric Specialty Exam: Review of Systems  There were no vitals taken for this visit.There is no height or weight on file to calculate BMI.  General Appearance: Well Groomed  Eye Contact:  Good  Speech:  Clear and Coherent and Normal Rate  Volume:  Normal  Mood:  Euthymic  Affect:  Congruent  Thought Process:  Coherent, Goal Directed and Linear  Orientation:  Full (Time, Place, and Person)  Thought Content: WDL and Logical   Suicidal Thoughts:  No  Homicidal Thoughts:  No  Memory:  Immediate;   Good Recent;   Good Remote;   Good  Judgement:  Good  Insight:  Good  Psychomotor Activity:  Normal  Concentration:  Concentration: Good and Attention Span: Good  Recall:  Good  Fund of Knowledge: Good  Language: Good  Akathisia:  No  Handed:  Right  AIMS (if indicated): Not done  Assets:  Communication Skills Desire for Improvement Financial Resources/Insurance Housing Social Support  ADL's:  Intact  Cognition: WNL  Sleep:  Good   Screenings: AIMS    Flowsheet Row Ad[facility] (Discharged) [facility]  AIMS Total Score 0      AUDIT    Flowsheet  Row Admission (Discharged) [facility]  Alcohol Use Disorder Identification Test Final Score (AUDIT) 10      GAD-7    Flowsheet Row Video Visit from 02/11/2023 in [facility] Video Visit from 12/03/2022 in [facility] Video Visit from 10/22/2022 in [facility] Video Visit from 12/26/2021 in [facility] Video Visit from 10/02/2021 in [facility]  Total GAD-7 Score 6 9 16 3 9       PHQ2-9    Flowsheet Row Video Visit from 02/11/2023 in [facility] Video Visit from 12/03/2022 in [facility] Video Visit from 10/22/2022 in [facility] Video Visit from 12/26/2021 in [facility] Video Visit from 10/02/2021 in [facility]  PHQ-2 Total Score 1 3 4 1 1   PHQ-9 Total Score 10 10 18 3 4       Flowsheet Row Video Visit from 10/22/2022 in [facility] Video Visit from 01/02/2021 in [facility] ED from 10/08/2020 in [facility] at [facility]  C-SSRS RISK CATEGORY Error: Q7 should not be populated when Q6 is No Error: Q7 should not be populated when Q6 is No High Risk        Assessment and Plan: Patient reports that her anxiety, depression, and mood has improved since her last visit.  She however reports that she continues to struggle with symptoms of ADHD. Today patient agreeable to start Qelbree 200 mg for a week and then increase to 400 mg  daily to help manage symptoms of ADHD.  At this time Strattera discontinued.  She will continue all other medications as prescribed.   1. Attention deficit hyperactivity disorder (ADHD), predominantly inattentive type  Start- viloxazine ER  (QELBREE) 200 MG 24 hr capsule; Take 2 capsules (400 mg total) by mouth daily.  Dispense: 60 capsule; Refill: 3 Continue- buPROPion (WELLBUTRIN XL) 150 MG 24 hr tablet; Take 1 tablet (150 mg total) by mouth every morning.  Dispense: 90 tablet; Refill: 1  2. GAD (generalized anxiety disorder)  Continue- busPIRone (BUSPAR) 30 MG tablet; Take 2 tablets (60 mg total) by mouth 3 (three) times daily.  Dispense: 90 tablet; Refill: 1 Continue- gabapentin (NEURONTIN) 300 MG capsule; Take 1 capsule (300 mg total) by mouth 3 (three) times daily.  Dispense: 270 capsule; Refill: 1 Continue- traZODone (DESYREL) 50 MG tablet; TAKE 1 TABLET BY MOUTH AT BEDTIME AS NEEDED FOR SLEEP  Dispense: 90 tablet; Refill: 1  3. Bipolar 2 disorder, major depressive episode  Continue- gabapentin (NEURONTIN) 300 MG capsule; Take 1 capsule (300 mg total) by mouth 3 (three) times daily.  Dispense: 270 capsule; Refill: 1 Continue- lamoTRIgine (LAMICTAL) 100 MG tablet; Take 1 tablet (100 mg total) by mouth daily. Take one tablet (25 mg)for two week, increase to two tablets (50 mg) on week 3, increase to 3 tablets (75 mg) on week 5.  Dispense: 90 tablet; Refill: 1 Continue- QUEtiapine (SEROQUEL) 100 MG tablet; TAKE 1 TABLET BY MOUTH EVERY DAY AT NIGHT  Dispense: 90 tablet; Refill: 1 Continue- traZODone (DESYREL) 50 MG tablet; TAKE 1 TABLET BY MOUTH AT BEDTIME AS NEEDED FOR SLEEP  Dispense: 90 tablet; Refill: 1     Follow-up in 3 months Follow-up with therapy   Shanna CiscoBrittney E Jamirra Curnow, NP 02/11/2023, 9:56 AM

## 2023-02-12 ENCOUNTER — Other Ambulatory Visit (HOSPITAL_COMMUNITY): Payer: Self-pay | Admitting: Psychiatry

## 2023-02-12 DIAGNOSIS — F900 Attention-deficit hyperactivity disorder, predominantly inattentive type: Secondary | ICD-10-CM

## 2023-02-18 ENCOUNTER — Telehealth (HOSPITAL_COMMUNITY): Payer: Self-pay | Admitting: *Deleted

## 2023-02-18 ENCOUNTER — Other Ambulatory Visit (HOSPITAL_COMMUNITY): Payer: Self-pay | Admitting: Psychiatry

## 2023-02-18 DIAGNOSIS — F900 Attention-deficit hyperactivity disorder, predominantly inattentive type: Secondary | ICD-10-CM

## 2023-02-18 MED ORDER — VILOXAZINE HCL ER 200 MG PO CP24
400.0000 mg | ORAL_CAPSULE | Freq: Every day | ORAL | 3 refills | Status: DC
Start: 2023-02-18 — End: 2023-03-16

## 2023-02-18 NOTE — Telephone Encounter (Signed)
Olivia Le reordered and sent to preferred pharmacy

## 2023-02-18 NOTE — Telephone Encounter (Signed)
Patient called stating that her medication has not been sent to her pharmacy. Upon chart review it was noted that Vaughan Basta has message under medication that it failed to send to pharmacy.

## 2023-03-03 ENCOUNTER — Encounter (HOSPITAL_COMMUNITY): Payer: Self-pay | Admitting: *Deleted

## 2023-03-05 ENCOUNTER — Telehealth (HOSPITAL_COMMUNITY): Payer: Self-pay | Admitting: *Deleted

## 2023-03-05 ENCOUNTER — Other Ambulatory Visit (HOSPITAL_COMMUNITY): Payer: Self-pay | Admitting: Psychiatry

## 2023-03-05 DIAGNOSIS — F3181 Bipolar II disorder: Secondary | ICD-10-CM

## 2023-03-05 MED ORDER — LAMOTRIGINE 100 MG PO TABS
100.0000 mg | ORAL_TABLET | Freq: Every day | ORAL | 3 refills | Status: DC
Start: 2023-03-05 — End: 2023-10-15

## 2023-03-05 NOTE — Telephone Encounter (Signed)
Order rewritten and sent to preferred pharmacy.

## 2023-03-05 NOTE — Telephone Encounter (Signed)
Fax from [facility] to have the prescriber of patients Lamotrigine clarify order. Will forward this request to the Provider, its being titrated up. Dr Doyne Keel to clarify and resend clarification to the pharmacy.

## 2023-03-11 ENCOUNTER — Telehealth (HOSPITAL_COMMUNITY): Payer: Self-pay | Admitting: *Deleted

## 2023-03-11 NOTE — Telephone Encounter (Signed)
Patient called for prior authorization of Qelbree 200mg , Marchia Bond, approval received until 09/07/23 with Request ID [id number]. Called to notify patient.

## 2023-03-16 ENCOUNTER — Telehealth (HOSPITAL_COMMUNITY): Payer: Self-pay | Admitting: *Deleted

## 2023-03-16 ENCOUNTER — Other Ambulatory Visit (HOSPITAL_COMMUNITY): Payer: Self-pay | Admitting: Psychiatry

## 2023-03-16 DIAGNOSIS — F3181 Bipolar II disorder: Secondary | ICD-10-CM

## 2023-03-16 DIAGNOSIS — F900 Attention-deficit hyperactivity disorder, predominantly inattentive type: Secondary | ICD-10-CM

## 2023-03-16 DIAGNOSIS — F411 Generalized anxiety disorder: Secondary | ICD-10-CM

## 2023-03-16 MED ORDER — VILOXAZINE HCL ER 200 MG PO CP24
400.0000 mg | ORAL_CAPSULE | Freq: Every day | ORAL | 3 refills | Status: DC
Start: 2023-03-16 — End: 2023-10-15

## 2023-03-16 NOTE — Telephone Encounter (Signed)
Provider called a patient and told her that she has refills on the above medications. She notes that she needs a refill of Qulbree. She notes that she was informed by nursing staff that her prior Berkley Harvey was completed. [name] reordered and sent to preferred pharmacy.  No other concerns noted at this time.

## 2023-03-16 NOTE — Telephone Encounter (Signed)
Rx Refill Request  buPROPion (WELLBUTRIN XL) 150 MG 24 hr tablet   busPIRone (BUSPAR) 30 MG tablet   traZODone (DESYREL) 50 MG tablet   lamoTRIgine (LAMICTAL) 100 MG tablet   gabapentin (NEURONTIN) 300 MG capsule   QUEtiapine (SEROQUEL) 100 MG tablet   traZODone (DESYREL) 50 MG tablet

## 2023-04-13 ENCOUNTER — Telehealth (HOSPITAL_COMMUNITY): Payer: Self-pay

## 2023-04-13 ENCOUNTER — Other Ambulatory Visit (HOSPITAL_COMMUNITY): Payer: Self-pay | Admitting: Psychiatry

## 2023-04-13 DIAGNOSIS — F411 Generalized anxiety disorder: Secondary | ICD-10-CM

## 2023-04-13 DIAGNOSIS — F3181 Bipolar II disorder: Secondary | ICD-10-CM

## 2023-04-13 MED ORDER — GABAPENTIN 300 MG PO CAPS: 300.0000 mg | ORAL_CAPSULE | Freq: Three times a day (TID) | ORAL | 3 refills | Status: DC

## 2023-04-13 NOTE — Telephone Encounter (Signed)
Medication refilled and sent to preferred pharmacy

## 2023-04-13 NOTE — Telephone Encounter (Signed)
Patient informed Clinical research associate that she has been inattentive, forgetful, disorganized, and inattentive to mentally taxing task.  She notes that [name] makes her fatigue.  Patient notes that she would like to trial a stimulant.  Provider informed patient that she would have to pass a urine drug screen before stimulant will be considered.  She endorsed understanding and agreed.  Today provider ordered UDS, CBC, CMP, HgbA1c, prolactin level, LFT, and thyroid level.  No other concerns noted at this time.

## 2023-04-22 ENCOUNTER — Telehealth (HOSPITAL_COMMUNITY): Payer: Commercial Managed Care - HMO | Admitting: Psychiatry

## 2023-04-22 ENCOUNTER — Encounter (HOSPITAL_COMMUNITY): Payer: Self-pay

## 2023-05-19 NOTE — Telephone Encounter (Signed)
 Opened in error

## 2023-09-05 ENCOUNTER — Other Ambulatory Visit (HOSPITAL_COMMUNITY): Payer: Self-pay | Admitting: Psychiatry

## 2023-09-05 DIAGNOSIS — F3181 Bipolar II disorder: Secondary | ICD-10-CM

## 2023-09-30 ENCOUNTER — Telehealth (HOSPITAL_COMMUNITY): Payer: Self-pay | Admitting: Psychiatry

## 2023-09-30 ENCOUNTER — Other Ambulatory Visit (HOSPITAL_COMMUNITY): Payer: Self-pay | Admitting: Psychiatry

## 2023-09-30 DIAGNOSIS — F3181 Bipolar II disorder: Secondary | ICD-10-CM

## 2023-09-30 MED ORDER — QUETIAPINE FUMARATE 100 MG PO TABS
ORAL_TABLET | ORAL | 1 refills | Status: DC
Start: 2023-09-30 — End: 2023-10-15

## 2023-09-30 NOTE — Telephone Encounter (Signed)
[facility]ero[facility] to [facility] [address].

## 2023-10-15 ENCOUNTER — Other Ambulatory Visit (HOSPITAL_COMMUNITY): Payer: Self-pay | Admitting: Psychiatry

## 2023-10-15 ENCOUNTER — Telehealth (HOSPITAL_COMMUNITY): Payer: Commercial Managed Care - HMO | Admitting: Psychiatry

## 2023-10-15 ENCOUNTER — Encounter (HOSPITAL_COMMUNITY): Payer: Self-pay | Admitting: Psychiatry

## 2023-10-15 DIAGNOSIS — F411 Generalized anxiety disorder: Secondary | ICD-10-CM

## 2023-10-15 DIAGNOSIS — F3181 Bipolar II disorder: Secondary | ICD-10-CM

## 2023-10-15 DIAGNOSIS — F900 Attention-deficit hyperactivity disorder, predominantly inattentive type: Secondary | ICD-10-CM | POA: Diagnosis not present

## 2023-10-15 MED ORDER — QUETIAPINE FUMARATE 100 MG PO TABS: ORAL_TABLET | ORAL | 1 refills | Status: DC

## 2023-10-15 MED ORDER — BUSPIRONE HCL 30 MG PO TABS: 30.0000 mg | ORAL_TABLET | Freq: Two times a day (BID) | ORAL | 3 refills | Status: DC

## 2023-10-15 MED ORDER — GUANFACINE HCL ER 2 MG PO TB24: 2.0000 mg | ORAL_TABLET | Freq: Every day | ORAL | 3 refills | Status: DC

## 2023-10-15 MED ORDER — LAMOTRIGINE 100 MG PO TABS: 100.0000 mg | ORAL_TABLET | Freq: Every day | ORAL | 3 refills | Status: DC

## 2023-10-15 MED ORDER — GABAPENTIN 300 MG PO CAPS: 300.0000 mg | ORAL_CAPSULE | Freq: Three times a day (TID) | ORAL | 3 refills | Status: DC

## 2023-10-15 MED ORDER — BUPROPION HCL ER (XL) 450 MG PO TB24
450.0000 mg | ORAL_TABLET | Freq: Every day | ORAL | 3 refills | Status: DC
Start: 2023-10-15 — End: 2023-10-15

## 2023-10-15 NOTE — Progress Notes (Signed)
BH MD/PA/NP OP Progress Note Virtual Visit via Video Note  I connected with Olivia Le on 10/15/23 at 12:30 PM EST by a video enabled telemedicine application and verified that I am speaking with the correct person using two identifiers.  Location: Patient: Home Provider: Clinic   I discussed the limitations of evaluation and management by telemedicine and the availability of in person appointments. The patient expressed understanding and agreed to proceed.  I provided 30 minutes of non-face-to-face time during this encounter.       10/15/2023 1:07 PM Olivia Le  MRN:  [id number]  Chief Complaint: "I feel great".  HPI: 32 year old female seen today for follow-up psychiatric evaluation.  She has a psychiatric history of anxiety, depression, bipolar disorder, ADHD, alcohol dependence (sober 1 year), and SI.  She is managed on  trazodone 50 mg nightly, Lamictal 100 mg, Qulbree 400 mg daily, Seroquel 100 mg nightly, Buspar 30 mg three daily, and gabapentin 300 mg three times daily.  She informed Clinical research associate that her medications are somewhat effective in managing her psychiatric conditions.  Today she was well-groomed, pleasant, cooperative, and engaged in conversation.  Since restarting Seroquel her anxiety and sleep has been well managed. Patient notes at times she feels a block from enjoying life. She describes being dull at times. She notes that she continues to struggle with ADHD and some depression.  Today conducted a GAD-7 and patient scored a 10, at her last visit she scored a 6.  Provider also conducted PHQ-9 and patient scored an 11, at her last visit she scored a 10.  She endorses having adequate sleep and appetite.  Today she denies SI/HI/VAH, mania, paranoia.    Patient informed Clinical research associate that [name] was not effective in managing her ADHD.  She informed Clinical research associate that [name] was also ineffective.  Patient notes that she continues to be forgetful, disorganized, has poor  listening skills, and is inattentive to mentally taxing task.  Provider recommended increasing Wellbutrin as patient has found success in this in the past.  Today Wellbutrin 300 mg increased to 450 mg daily to help with symptoms of depression and ADHD.  Patient also agreeable to starting Intuniv 2 mg nightly to help manage symptoms of ADHD.  Provider informed patient that if these adjustments are not effective a stimulant can be trialed pending a negative UDS.  She endorsed understanding and agreed.BuSpar decreased from 30 mg 3 times daily to twice daily.  She will continue other medications as prescribed.  No other concerns noted at this time.        Visit Diagnosis:    ICD-10-CM   1. Attention deficit hyperactivity disorder (ADHD), predominantly inattentive type  F90.0 buPROPion 450 MG TB24    guanFACINE (INTUNIV) 2 MG TB24 ER tablet    2. Bipolar 2 disorder, major depressive episode (HCC)  F31.81 QUEtiapine (SEROQUEL) 100 MG tablet    lamoTRIgine (LAMICTAL) 100 MG tablet    gabapentin (NEURONTIN) 300 MG capsule    3. GAD (generalized anxiety disorder)  F41.1 gabapentin (NEURONTIN) 300 MG capsule    busPIRone (BUSPAR) 30 MG tablet       Past Psychiatric History: Anxiety, depression, ADHD, and SI  Past Medical History:  Past Medical History:  Diagnosis Date   Kidney stone    Polycystic disease, ovaries     Past Surgical History:  Procedure Laterality Date   TONSILLECTOMY      Family Psychiatric History: Brother Depression and mother depression  Family History:  Family History  Problem  Relation Age of Onset   Hypertension Mother    Healthy Father     Social History:  Social History   Socioeconomic History   Marital status: Single    Spouse name: Not on file   Number of children: Not on file   Years of education: Not on file   Highest education level: Not on file  Occupational History   Not on file  Tobacco Use   Smoking status: Never   Smokeless tobacco: Never   Vaping Use   Vaping status: Some Days   Substances: THC, CBD   Devices: Jule  Substance and Sexual Activity   Alcohol use: Yes    Comment: weekly   Drug use: No   Sexual activity: Not on file  Other Topics Concern   Not on file  Social History Narrative   Not on file   Social Drivers of Health   Financial Resource Strain: Not on file  Food Insecurity: No Food Insecurity (03/29/2021)   Received from [facility]   Hunger Vital Sign    Worried About Running Out of Food in the Last Year: Never true    Ran Out of Food in the Last Year: Never true  Transportation Needs: Not on file  Physical Activity: Not on file  Stress: Not on file  Social Connections: Unknown (03/12/2022)   Received from [facility], [facility]   Social Network    Social Network: Not on file    Allergies: No Known Allergies  Metabolic Disorder Labs: Lab Results  Component Value Date   HGBA1C 4.9 03/29/2020   MPG 93.93 03/29/2020   No results found for: "PROLACTIN" Lab Results  Component Value Date   CHOL 149 03/29/2020   TRIG 29 03/29/2020   HDL 97 03/29/2020   CHOLHDL 1.5 03/29/2020   VLDL 6 03/29/2020   LDLCALC 46 03/29/2020   Lab Results  Component Value Date   TSH 2.297 03/29/2020    Therapeutic Level Labs: No results found for: "LITHIUM" No results found for: "VALPROATE" No results found for: "CBMZ"  Current Medications: Current Outpatient Medications  Medication Sig Dispense Refill   buPROPion 450 MG TB24 Take 1 tablet (450 mg total) by mouth daily. 30 tablet 3   guanFACINE (INTUNIV) 2 MG TB24 ER tablet Take 1 tablet (2 mg total) by mouth daily. 30 tablet 3   Ascorbic Acid (VITAMIN C WITH ROSE HIPS) 500 MG tablet Take 500 mg by mouth daily.     busPIRone (BUSPAR) 30 MG tablet Take 1 tablet (30 mg total) by mouth in the morning and at bedtime. 60 tablet 3   cholecalciferol (VITAMIN D3) 25 MCG (1000 UNIT) tablet Take 1,000 Units by mouth daily.     gabapentin  (NEURONTIN) 300 MG capsule Take 1 capsule (300 mg total) by mouth 3 (three) times daily. 270 capsule 3   lamoTRIgine (LAMICTAL) 100 MG tablet Take 1 tablet (100 mg total) by mouth daily. 30 tablet 3   QUEtiapine (SEROQUEL) 100 MG tablet TAKE 1 TABLET BY MOUTH EVERY DAY AT NIGHT 90 tablet 1   No current facility-administered medications for this visit.     Musculoskeletal: Strength & Muscle Tone: within normal limits and Telehealth visit Gait & Station: normal, Telehealth visit Patient leans: N/A  Psychiatric Specialty Exam: Review of Systems  There were no vitals taken for this visit.There is no height or weight on file to calculate BMI.  General Appearance: Well Groomed  Eye Contact:  Good  Speech:  Clear and Coherent and Normal Rate  Volume:  Normal  Mood:  Anxious and Depressed  Affect:  Congruent  Thought Process:  Coherent, Goal Directed and Linear  Orientation:  Full (Time, Place, and Person)  Thought Content: WDL and Logical   Suicidal Thoughts:  No  Homicidal Thoughts:  No  Memory:  Immediate;   Good Recent;   Good Remote;   Good  Judgement:  Good  Insight:  Good  Psychomotor Activity:  Normal  Concentration:  Concentration: Good and Attention Span: Good  Recall:  Good  Fund of Knowledge: Good  Language: Good  Akathisia:  No  Handed:  Right  AIMS (if indicated): Not done  Assets:  Communication Skills Desire for Improvement Financial Resources/Insurance Housing Social Support  ADL's:  Intact  Cognition: WNL  Sleep:  Good   Screenings: AIMS    Flowsheet Row Ad[facility] (Discharged[facility]  AIMS Total Score 0      AUDIT    Flowsheet Row Admission (Discharged) from [facility]  Alcohol Use Disorder Identification Test Final Score (AUDIT) 10      GAD-7    Flowsheet Row Video Visit from 10/15/2023 in [facility] Video Visit from 02/11/2023 in [facility] Video Visit from 12/03/2022 in [facility] Video Visit from 10/22/2022 in [facility] Video Visit from 12/26/2021 in [facility]  Total GAD-7 Score 10 6 9 16 3       PHQ2-9    Flowsheet Row Video Visit from 10/15/2023 in [facility] Video Visit from 02/11/2023 in [facility] Video Visit from 12/03/2022 in [facility] Video Visit from 10/22/2022 in [facility] Video Visit from 12/26/2021 in [facility]  PHQ-2 Total Score 5 1 3 4 1   PHQ-9 Total Score 11 10 10 18 3       Flowsheet Row Video Visit from 10/22/2022 in [facility] Video Visit from 01/02/2021 in [facility] ED from 10/08/2020 in [facility] at [facility]  C-SSRS RISK CATEGORY Error: Q7 should not be populated when Q6 is No Error: Q7 should not be populated when Q6 is No High Risk        Assessment and Plan: Patient reports that her anxiety and sleep well-managed however notes that she continues to struggle with some depression and ADHD.  Patient informed Clinical research associate that [name] was not effective in managing her ADHD.  She informed Clinical research associate that [name] was also ineffective.  Patient notes that she continues to be forgetful, disorganized, has poor listening skills, and is inattentive to mentally taxing task.  Provider recommended increasing Wellbutrin as patient has found success in this in the past.  Today Wellbutrin 300 mg increased to 450 mg daily to help with symptoms of depression and ADHD.  Patient also agreeable to starting Intuniv 2 mg nightly to help manage symptoms of ADHD.  Provider informed patient that if these adjustments are not  effective a stimulant can be trialed pending a negative UDS.  She endorsed understanding and agreed.  BuSpar decreased from 30 mg 3 times daily to twice daily.  She will continue other medications as prescribed.   1. Bipolar 2 disorder, major depressive episode (HCC)  Continue-  QUEtiapine (SEROQUEL) 100 MG tablet; TAKE 1 TABLET BY MOUTH EVERY DAY AT NIGHT  Dispense: 90 tablet; Refill: 1 Continue- lamoTRIgine (LAMICTAL) 100 MG tablet; Take 1 tablet (100 mg total) by mouth daily.  Dispense: 30 tablet; Refill: 3 Continue- gabapentin (NEURONTIN) 300 MG capsule; Take 1 capsule (300 mg total) by mouth 3 (three) times daily.  Dispense: 270 capsule; Refill: 3  2. GAD (generalized anxiety disorder)  Continue- gabapentin (NEURONTIN) 300 MG capsule; Take 1 capsule (300 mg total) by mouth 3 (three) times daily.  Dispense: 270 capsule; Refill: 3 Reduced- busPIRone (BUSPAR) 30 MG tablet; Take 1 tablet (30 mg total) by mouth in the morning and at bedtime.  Dispense: 60 tablet; Refill: 3  3. Attention deficit hyperactivity disorder (ADHD), predominantly inattentive type (Primary)  Increased- buPROPion 450 MG TB24; Take 1 tablet (450 mg total) by mouth daily.  Dispense: 30 tablet; Refill: 3 Start- guanFACINE (INTUNIV) 2 MG TB24 ER tablet; Take 1 tablet (2 mg total) by mouth daily.  Dispense: 30 tablet; Refill: 3    Follow-up in 2 months Follow-up with therapy   Shanna Cisco, NP 10/15/2023, 1:07 PM

## 2023-11-09 ENCOUNTER — Other Ambulatory Visit (HOSPITAL_COMMUNITY): Payer: Self-pay | Admitting: Psychiatry

## 2023-11-09 DIAGNOSIS — F411 Generalized anxiety disorder: Secondary | ICD-10-CM

## 2023-11-09 DIAGNOSIS — F900 Attention-deficit hyperactivity disorder, predominantly inattentive type: Secondary | ICD-10-CM

## 2023-12-16 ENCOUNTER — Encounter (HOSPITAL_COMMUNITY): Payer: Self-pay | Admitting: Psychiatry

## 2023-12-16 ENCOUNTER — Telehealth (INDEPENDENT_AMBULATORY_CARE_PROVIDER_SITE_OTHER): Payer: Commercial Managed Care - HMO | Admitting: Psychiatry

## 2023-12-16 DIAGNOSIS — F411 Generalized anxiety disorder: Secondary | ICD-10-CM

## 2023-12-16 DIAGNOSIS — Z0000 Encounter for general adult medical examination without abnormal findings: Secondary | ICD-10-CM

## 2023-12-16 DIAGNOSIS — F3181 Bipolar II disorder: Secondary | ICD-10-CM | POA: Diagnosis not present

## 2023-12-16 DIAGNOSIS — F900 Attention-deficit hyperactivity disorder, predominantly inattentive type: Secondary | ICD-10-CM

## 2023-12-16 MED ORDER — GABAPENTIN 300 MG PO CAPS: 300.0000 mg | ORAL_CAPSULE | Freq: Three times a day (TID) | ORAL | 3 refills | Status: DC

## 2023-12-16 MED ORDER — BUPROPION HCL ER (XL) 450 MG PO TB24: 1.0000 | ORAL_TABLET | Freq: Every day | ORAL | 3 refills | Status: DC

## 2023-12-16 MED ORDER — LAMOTRIGINE 100 MG PO TABS: 100.0000 mg | ORAL_TABLET | Freq: Every day | ORAL | 3 refills | Status: DC

## 2023-12-16 MED ORDER — BUSPIRONE HCL 30 MG PO TABS: 60.0000 mg | ORAL_TABLET | Freq: Two times a day (BID) | ORAL | 3 refills | Status: DC

## 2023-12-16 MED ORDER — QUETIAPINE FUMARATE 100 MG PO TABS: ORAL_TABLET | ORAL | 1 refills | Status: DC

## 2023-12-16 MED ORDER — GUANFACINE HCL ER 2 MG PO TB24: 2.0000 mg | ORAL_TABLET | Freq: Every day | ORAL | 2 refills | Status: DC

## 2023-12-16 NOTE — Progress Notes (Signed)
BH MD/PA/NP OP Progress Note Virtual Visit via Video Note  I connected with Olivia Le on 12/16/23 at  2:00 PM EST by a video enabled telemedicine application and verified that I am speaking with the correct person using two identifiers.  Location: Patient: Home Provider: Clinic   I discussed the limitations of evaluation and management by telemedicine and the availability of in person appointments. The patient expressed understanding and agreed to proceed.  I provided 30 minutes of non-face-to-face time during this encounter.       12/16/2023 2:24 PM Olivia Le  MRN:  [id number]  Chief Complaint: "My ADHD affects my depression".  HPI: 33 year old female seen today for follow-up psychiatric evaluation.  She has a psychiatric history of anxiety, depression, bipolar disorder, ADHD, alcohol dependence (sober 1 year), and SI.  She is managed on  trazodone 50 mg nightly, Lamictal 100 mg, Wellbutrin 450 mg daily, Intuniv 2 mg daily, Seroquel 100 mg nightly, Buspar 30 mg twice daily, and gabapentin 300 mg three times daily.  She informed Clinical research associate that her medications are somewhat effective in managing her psychiatric conditions.  Today she was well-groomed, pleasant, cooperative, and engaged in conversation.  She informed Clinical research associate that her ADHD impacts her depression.  She reports that she feels sluggish and has lingering anxiety.  At times she reports that is difficult to do task that she once enjoyed.  At patient's last visit provider informed her that a stimulant could be trialed as she is tried Wellbutrin, Strattera, Intuniv, and Qelbree without much success.  Provider informed patient that the stimulant would not be started until she passed a UDS. She endorsed understanding.  Patient reports that her anxiety and depression has somewhat improved since her last visit.  Today conducted a GAD-7 and patient scored a 9, at her last visit she scored a 10.  Provider also conducted PHQ-9 and  patient scored an 7, at her last visit she scored a 11.  She endorses having adequate sleep and appetite.  Today she denies SI/HI/VAH, mania, paranoia.     Patient notes that she continues to be forgetful, disorganized, has poor listening skills, and is inattentive to mentally taxing task.  Adderall 5 mg twice daily will be started to help manage symptoms of ADHD pending a negative UDS.  Provider also ordered CBC, CMP, lipid panel, HgbA1c, thyroid panel, and LFT.  Patient referred to [facility] [place] at [facility] for primary care.  She will continue other medications as prescribed.  No other concerns noted at this time.        Visit Diagnosis:    ICD-10-CM   1. Well adult exam  Z00.00 Ambulatory referral to Internal Medicine    2. Attention deficit hyperactivity disorder (ADHD), predominantly inattentive type  F90.0 buPROPion HCl ER, XL, 450 MG TB24    guanFACINE (INTUNIV) 2 MG TB24 ER tablet    3. GAD (generalized anxiety disorder)  F41.1 busPIRone (BUSPAR) 30 MG tablet    gabapentin (NEURONTIN) 300 MG capsule    4. Bipolar 2 disorder, major depressive episode (HCC)  F31.81 Urine Drug Panel 7    CBC with Differential/Platelet    Comprehensive Metabolic Panel (CMET)    Thyroid Panel With TSH    Hepatic function panel    gabapentin (NEURONTIN) 300 MG capsule    QUEtiapine (SEROQUEL) 100 MG tablet    lamoTRIgine (LAMICTAL) 100 MG tablet    Lipid panel    HgB A1c        Past Psychiatric  History: Anxiety, depression, ADHD, and SI  Past Medical History:  Past Medical History:  Diagnosis Date   Kidney stone    Polycystic disease, ovaries     Past Surgical History:  Procedure Laterality Date   TONSILLECTOMY      Family Psychiatric History: Brother Depression and mother depression  Family History:  Family History  Problem Relation Age of Onset   Hypertension Mother    Healthy Father     Social History:  Social History   Socioeconomic History   Marital status:  Single    Spouse name: Not on file   Number of children: Not on file   Years of education: Not on file   Highest education level: Not on file  Occupational History   Not on file  Tobacco Use   Smoking status: Never   Smokeless tobacco: Never  Vaping Use   Vaping status: Some Days   Substances: THC, CBD   Devices: Jule  Substance and Sexual Activity   Alcohol use: Yes    Comment: weekly   Drug use: No   Sexual activity: Not on file  Other Topics Concern   Not on file  Social History Narrative   Not on file   Social Drivers of Health   Financial Resource Strain: Not on file  Food Insecurity: No Food Insecurity (03/29/2021)   Received from [facility], [facility]   Hunger Vital Sign    Worried About Running Out of Food in the Last Year: Never true    Ran Out of Food in the Last Year: Never true  Transportation Needs: Not on file  Physical Activity: Not on file  Stress: Not on file  Social Connections: Unknown (03/12/2022)   Received from [facility], [facility]   Social Network    Social Network: Not on file    Allergies: No Known Allergies  Metabolic Disorder Labs: Lab Results  Component Value Date   HGBA1C 4.9 03/29/2020   MPG 93.93 03/29/2020   No results found for: "PROLACTIN" Lab Results  Component Value Date   CHOL 149 03/29/2020   TRIG 29 03/29/2020   HDL 97 03/29/2020   CHOLHDL 1.5 03/29/2020   VLDL 6 03/29/2020   LDLCALC 46 03/29/2020   Lab Results  Component Value Date   TSH 2.297 03/29/2020    Therapeutic Level Labs: No results found for: "LITHIUM" No results found for: "VALPROATE" No results found for: "CBMZ"  Current Medications: Current Outpatient Medications  Medication Sig Dispense Refill   Ascorbic Acid (VITAMIN C WITH ROSE HIPS) 500 MG tablet Take 500 mg by mouth daily.     buPROPion HCl ER, XL, 450 MG TB24 Take 1 tablet (450 mg total) by mouth daily. 30 tablet 3   busPIRone (BUSPAR) 30 MG tablet Take 2 tablets (60 mg  total) by mouth 2 (two) times daily. 60 tablet 3   cholecalciferol (VITAMIN D3) 25 MCG (1000 UNIT) tablet Take 1,000 Units by mouth daily.     gabapentin (NEURONTIN) 300 MG capsule Take 1 capsule (300 mg total) by mouth 3 (three) times daily. 270 capsule 3   guanFACINE (INTUNIV) 2 MG TB24 ER tablet Take 1 tablet (2 mg total) by mouth daily. 90 tablet 2   lamoTRIgine (LAMICTAL) 100 MG tablet Take 1 tablet (100 mg total) by mouth daily. 30 tablet 3   QUEtiapine (SEROQUEL) 100 MG tablet TAKE 1 TABLET BY MOUTH EVERY DAY AT NIGHT 90 tablet 1   No current facility-administered medications for this  visit.     Musculoskeletal: Strength & Muscle Tone: within normal limits and Telehealth visit Gait & Station: normal, Telehealth visit Patient leans: N/A  Psychiatric Specialty Exam: Review of Systems  There were no vitals taken for this visit.There is no height or weight on file to calculate BMI.  General Appearance: Well Groomed  Eye Contact:  Good  Speech:  Clear and Coherent and Normal Rate  Volume:  Normal  Mood:  Anxious and Depressed, improving  Affect:  Congruent  Thought Process:  Coherent, Goal Directed and Linear  Orientation:  Full (Time, Place, and Person)  Thought Content: WDL and Logical   Suicidal Thoughts:  No  Homicidal Thoughts:  No  Memory:  Immediate;   Good Recent;   Good Remote;   Good  Judgement:  Good  Insight:  Good  Psychomotor Activity:  Normal  Concentration:  Concentration: Fair and Attention Span: Fair  Recall:  Good  Fund of Knowledge: Good  Language: Good  Akathisia:  No  Handed:  Right  AIMS (if indicated): Not done  Assets:  Communication Skills Desire for Improvement Financial Resources/Insurance Housing Social Support  ADL's:  Intact  Cognition: WNL  Sleep:  Good   Screenings: AIMS    Flowsheet Row Admission (Discharged) [facility]  AIMS Total Score 0      AUDIT     Flowsheet Row Admission (Discharged) from [facility]  Alcohol Use Disorder Identification Test Final Score (AUDIT) 10      GAD-7    Flowsheet Row Video Visit from 12/16/2023 in [facility] Video Visit from 10/15/2023 in [facility] Video Visit from 02/11/2023 in [facility] Video Visit from 12/03/2022 in [facility] Video Visit from 10/22/2022 in [facility]  Total GAD-7 Score 9 10 6 9 16       PHQ2-9    Flowsheet Row Video Visit from 12/16/2023 in [facility] Video Visit from 10/15/2023 in [facility] Video Visit from 02/11/2023 in [facility] Video Visit from 12/03/2022 in [facility] Video Visit from 10/22/2022 in [facility]  PHQ-2 Total Score 3 5 1 3 4   PHQ-9 Total Score 7 11 10 10 18       Flowsheet Row Video Visit from 10/22/2022 in [facility] Video Visit from 01/02/2021 in [facility] ED from 10/08/2020 in [facility] Department at [facility]  C-SSRS RISK CATEGORY Error: Q7 should not be populated when Q6 is No Error: Q7 should not be populated when Q6 is No High Risk        Assessment and Plan: Patient reports that her anxiety and depression has somewhat improve. She does note that her ADHD affects her depression.   Patient notes that she continues to be forgetful, disorganized, has poor listening skills, and is inattentive to mentally taxing task.  Adderall 5 mg twice daily will be started to help manage symptoms of ADHD pending a negative UDS.  Provider also ordered CBC, CMP, lipid panel, HgbA1c, thyroid panel, and LFT.  Patient referred to [facility] [place] at  [facility] for primary care.  She will continue other medications as prescribed.    1. Attention deficit hyperactivity disorder (ADHD), predominantly inattentive  type  Continue- buPROPion HCl ER, XL, 450 MG TB24; Take 1 tablet (450 mg total) by mouth daily.  Dispense: 30 tablet; Refill: 3 Continue- guanFACINE (INTUNIV) 2 MG TB24 ER tablet; Take 1 tablet (2 mg total) by mouth daily.  Dispense: 90 tablet; Refill: 2  2. GAD (generalized anxiety disorder)  Continue- busPIRone (BUSPAR) 30 MG tablet; Take 2 tablets (60 mg total) by mouth 2 (two) times daily.  Dispense: 60 tablet; Refill: 3 Continue- gabapentin (NEURONTIN) 300 MG capsule; Take 1 capsule (300 mg total) by mouth 3 (three) times daily.  Dispense: 270 capsule; Refill: 3  3. Bipolar 2 disorder, major depressive episode (HCC)  - Urine Drug Panel 7; Future - CBC with Differential/Platelet; Future - Comprehensive Metabolic Panel (CMET); Future - Thyroid Panel With TSH; Future - Hepatic function panel; Future Continue- gabapentin (NEURONTIN) 300 MG capsule; Take 1 capsule (300 mg total) by mouth 3 (three) times daily.  Dispense: 270 capsule; Refill: 3 Continue- QUEtiapine (SEROQUEL) 100 MG tablet; TAKE 1 TABLET BY MOUTH EVERY DAY AT NIGHT  Dispense: 90 tablet; Refill: 1 Continue- lamoTRIgine (LAMICTAL) 100 MG tablet; Take 1 tablet (100 mg total) by mouth daily.  Dispense: 30 tablet; Refill: 3 - Lipid panel; Future - HgB A1c; Future  4. Well adult exam (Primary)  - Ambulatory referral to Internal Medicine    Follow-up in 2 months Follow-up with therapy   Shanna Cisco, NP 12/16/2023, 2:24 PM

## 2023-12-26 ENCOUNTER — Other Ambulatory Visit (HOSPITAL_COMMUNITY): Payer: Self-pay | Admitting: Psychiatry

## 2023-12-26 DIAGNOSIS — F411 Generalized anxiety disorder: Secondary | ICD-10-CM

## 2024-01-22 ENCOUNTER — Emergency Department (HOSPITAL_BASED_OUTPATIENT_CLINIC_OR_DEPARTMENT_OTHER)
Admission: EM | Admit: 2024-01-22 | Discharge: 2024-01-22 | Disposition: A | Discharge status: Home / Self Care | Attending: Emergency Medicine | Admitting: Emergency Medicine

## 2024-01-22 ENCOUNTER — Other Ambulatory Visit: Payer: Self-pay

## 2024-01-22 ENCOUNTER — Emergency Department (HOSPITAL_BASED_OUTPATIENT_CLINIC_OR_DEPARTMENT_OTHER)

## 2024-01-22 ENCOUNTER — Encounter (HOSPITAL_BASED_OUTPATIENT_CLINIC_OR_DEPARTMENT_OTHER): Payer: Self-pay

## 2024-01-22 DIAGNOSIS — R1031 Right lower quadrant pain: Secondary | ICD-10-CM | POA: Insufficient documentation

## 2024-01-22 DIAGNOSIS — N23 Unspecified renal colic: Secondary | ICD-10-CM

## 2024-01-22 LAB — CBC WITH DIFFERENTIAL/PLATELET
Abs Immature Granulocytes: 0.04 10*3/uL (ref 0.00–0.07)
Basophils Absolute: 0 10*3/uL (ref 0.0–0.1)
Basophils Relative: 0 %
Eosinophils Absolute: 0.2 10*3/uL (ref 0.0–0.5)
Eosinophils Relative: 2 %
HCT: 36.7 % (ref 36.0–46.0)
Hemoglobin: 12.3 g/dL (ref 12.0–15.0)
Immature Granulocytes: 0 %
Lymphocytes Relative: 44 %
Lymphs Abs: 4.6 10*3/uL — ABNORMAL HIGH (ref 0.7–4.0)
MCH: 29.3 pg (ref 26.0–34.0)
MCHC: 33.5 g/dL (ref 30.0–36.0)
MCV: 87.4 fL (ref 80.0–100.0)
Monocytes Absolute: 0.8 10*3/uL (ref 0.1–1.0)
Monocytes Relative: 8 %
Neutro Abs: 4.8 10*3/uL (ref 1.7–7.7)
Neutrophils Relative %: 46 %
Platelets: 309 10*3/uL (ref 150–400)
RBC: 4.2 MIL/uL (ref 3.87–5.11)
RDW: 13.3 % (ref 11.5–15.5)
WBC: 10.4 10*3/uL (ref 4.0–10.5)
nRBC: 0 % (ref 0.0–0.2)

## 2024-01-22 LAB — URINALYSIS, ROUTINE W REFLEX MICROSCOPIC
Bilirubin Urine: NEGATIVE
Glucose, UA: NEGATIVE mg/dL
Ketones, ur: NEGATIVE mg/dL
Leukocytes,Ua: NEGATIVE
Nitrite: NEGATIVE
Protein, ur: NEGATIVE mg/dL
Specific Gravity, Urine: 1.007 (ref 1.005–1.030)
pH: 7 (ref 5.0–8.0)

## 2024-01-22 LAB — COMPREHENSIVE METABOLIC PANEL
ALT: 10 U/L (ref 0–44)
AST: 14 U/L — ABNORMAL LOW (ref 15–41)
Albumin: 4.7 g/dL (ref 3.5–5.0)
Alkaline Phosphatase: 46 U/L (ref 38–126)
Anion gap: 7 (ref 5–15)
BUN: 13 mg/dL (ref 6–20)
CO2: 25 mmol/L (ref 22–32)
Calcium: 9.4 mg/dL (ref 8.9–10.3)
Chloride: 104 mmol/L (ref 98–111)
Creatinine, Ser: 0.75 mg/dL (ref 0.44–1.00)
GFR, Estimated: >60 mL/min (ref >60)
Glucose, Bld: 85 mg/dL (ref 70–99)
Potassium: 3.4 mmol/L — ABNORMAL LOW (ref 3.5–5.1)
Sodium: 136 mmol/L (ref 135–145)
Total Bilirubin: 0.3 mg/dL (ref 0.0–1.2)
Total Protein: 7.2 g/dL (ref 6.5–8.1)

## 2024-01-22 LAB — LIPASE, BLOOD: Lipase: 30 U/L (ref 11–51)

## 2024-01-22 LAB — PREGNANCY, URINE: Preg Test, Ur: NEGATIVE

## 2024-01-22 MED ORDER — ONDANSETRON HCL 4 MG/2ML IJ SOLN
4.0000 mg | Freq: Once | INTRAMUSCULAR | Status: AC
  Administered 2024-01-22: 4 mg via INTRAVENOUS
  Filled 2024-01-22: qty 2

## 2024-01-22 MED ORDER — KETOROLAC TROMETHAMINE 30 MG/ML IJ SOLN
30.0000 mg | Freq: Once | INTRAMUSCULAR | Status: AC
  Administered 2024-01-22: 30 mg via INTRAVENOUS
  Filled 2024-01-22: qty 1

## 2024-01-22 MED ORDER — HYDROCODONE-ACETAMINOPHEN 5-325 MG PO TABS: 1.0000 | ORAL_TABLET | Freq: Four times a day (QID) | ORAL | 0 refills | Status: AC | PRN

## 2024-01-22 NOTE — ED Triage Notes (Signed)
 RLQ / groin pain manifesting just prior to arrival.   Says she's had kidney stone in the past and it has similar sensation.

## 2024-01-22 NOTE — Discharge Instructions (Addendum)
 Begin taking ibuprofen 600 mg every 6 hours as needed for pain.  Begin taking hydrocodone as prescribed as needed for pain not relieved with ibuprofen.  Follow-up with urology if your symptoms or not improving in the next few days.  The contact information for [facility] has been provided in this discharge summary for you to call and make these arrangements.  Return to the ER for worsening pain, high fevers, or for other new and concerning symptoms.

## 2024-01-22 NOTE — ED Provider Notes (Signed)
 [place] EMERGENCY DEPARTMENT AT [facility] Provider Note   CSN: [id number] Arrival date & time: 01/22/24  0104     History  Chief Complaint  Patient presents with   Abdominal Pain    Olivia Le is a 33 y.o. female.  Patient is a 33 year old female with past medical history of ADHD, bipolar disorder, anxiety, prior kidney stones.  Patient presenting today with complaints of right sided abdominal pain.  This started earlier this evening and has gradually worsened.  She describes pain to the right lower quadrant that is crampy and sharp in nature.  She is concerned she may have another kidney stone.  She denies any bowel or bladder complaints.  No fevers or chills.  No alleviating factors.  Pain worse with movement and palpation.  The history is provided by the patient.       Home Medications Prior to Admission medications   Medication Sig Start Date End Date Taking? Authorizing Provider  Ascorbic Acid (VITAMIN C WITH ROSE HIPS) 500 MG tablet Take 500 mg by mouth daily.    [provider]  buPROPion HCl ER, XL, 450 MG TB24 Take 1 tablet (450 mg total) by mouth daily. 12/16/23   Shanna Cisco, NP  busPIRone (BUSPAR) 30 MG tablet Take 2 tablets (60 mg total) by mouth 2 (two) times daily. 12/16/23   Shanna Cisco, NP  cholecalciferol (VITAMIN D3) 25 MCG (1000 UNIT) tablet Take 1,000 Units by mouth daily.    [provider]  gabapentin (NEURONTIN) 300 MG capsule Take 1 capsule (300 mg total) by mouth 3 (three) times daily. 12/16/23   Shanna Cisco, NP  guanFACINE (INTUNIV) 2 MG TB24 ER tablet Take 1 tablet (2 mg total) by mouth daily. 12/16/23   Shanna Cisco, NP  lamoTRIgine (LAMICTAL) 100 MG tablet Take 1 tablet (100 mg total) by mouth daily. 12/16/23   Shanna Cisco, NP  QUEtiapine (SEROQUEL) 100 MG tablet TAKE 1 TABLET BY MOUTH EVERY DAY AT NIGHT 12/16/23   Shanna Cisco, NP      Allergies    Patient has no known  allergies.    Review of Systems   Review of Systems  All other systems reviewed and are negative.   Physical Exam Updated Vital Signs BP (!) 129/98   Pulse 74   Temp 98.5 F (36.9 C)   Resp 17   Ht 5\' 6"  (1.676 m)   Wt 72.6 kg   LMP 01/08/2024 (Approximate)   SpO2 100%   BMI 25.82 kg/m  Physical Exam Vitals and nursing note reviewed.  Constitutional:      General: She is not in acute distress.    Appearance: She is well-developed. She is not diaphoretic.  HENT:     Head: Normocephalic and atraumatic.  Cardiovascular:     Rate and Rhythm: Normal rate and regular rhythm.     Heart sounds: No murmur heard.    No friction rub. No gallop.  Pulmonary:     Effort: Pulmonary effort is normal. No respiratory distress.     Breath sounds: Normal breath sounds. No wheezing.  Abdominal:     General: Bowel sounds are normal. There is no distension.     Palpations: Abdomen is soft.     Tenderness: There is abdominal tenderness in the right lower quadrant. There is no right CVA tenderness, left CVA tenderness, guarding or rebound.  Musculoskeletal:        General: Normal range of motion.  Cervical back: Normal range of motion and neck supple.  Skin:    General: Skin is warm and dry.  Neurological:     General: No focal deficit present.     Mental Status: She is alert and oriented to person, place, and time.     ED Results / Procedures / Treatments   Labs (all labs ordered are listed, but only abnormal results are displayed) Labs Reviewed  LIPASE, BLOOD  COMPREHENSIVE METABOLIC PANEL  URINALYSIS, ROUTINE W REFLEX MICROSCOPIC  PREGNANCY, URINE  CBC WITH DIFFERENTIAL/PLATELET    EKG None  Radiology No results found.  Procedures Procedures    Medications Ordered in ED Medications  ketorolac (TORADOL) 30 MG/ML injection 30 mg (has no administration in time range)  ondansetron (ZOFRAN) injection 4 mg (has no administration in time range)    ED Course/ Medical  Decision Making/ A&P  Patient is a 33 year old female presenting with complaints of right lower quadrant pain as described in the HPI.  Patient arrives with stable vital signs and is afebrile.  Physical examination reveals right sided abdominal tenderness, but is otherwise unremarkable.  Laboratory studies obtained including CBC, CMP, and lipase, all of which are basically unremarkable.  CT scan obtained of the abdomen and pelvis showing right-sided hydronephrosis, but no obvious stone.  Patient given Toradol and Zofran and seems to be feeling significantly improved.  I am uncertain as to the etiology of the hydronephrosis, but suspect either on visualized renal calculus or recently passed stone.  Patient will be discharged with pain medication and is to follow-up with urology if not improving through the weekend.  Final Clinical Impression(s) / ED Diagnoses Final diagnoses:  None    Rx / DC Orders ED Discharge Orders     None         Geoffery Lyons, MD 01/22/24 [phone]

## 2024-03-09 ENCOUNTER — Encounter (HOSPITAL_COMMUNITY): Payer: Self-pay | Admitting: Psychiatry

## 2024-03-09 ENCOUNTER — Telehealth (HOSPITAL_COMMUNITY): Payer: Commercial Managed Care - HMO | Admitting: Psychiatry

## 2024-03-09 DIAGNOSIS — F3181 Bipolar II disorder: Secondary | ICD-10-CM | POA: Diagnosis not present

## 2024-03-09 MED ORDER — TRAZODONE HCL 50 MG PO TABS: 50.0000 mg | ORAL_TABLET | Freq: Every evening | ORAL | 3 refills | Status: DC | PRN

## 2024-03-09 MED ORDER — QUETIAPINE FUMARATE 100 MG PO TABS: ORAL_TABLET | ORAL | 1 refills | Status: DC

## 2024-03-09 NOTE — Progress Notes (Signed)
 [facility] MD/PA/NP OP Progress Note Virtual Visit via Video Note  I connected with Olivia Le on 03/09/24 at  2:00 PM EDT by a video enabled telemedicine application and verified that I am speaking with the correct person using two identifiers.  Location: Patient: Home Provider: Clinic   I discussed the limitations of evaluation and management by telemedicine and the availability of in person appointments. The patient expressed understanding and agreed to proceed.  I provided 30 minutes of non-face-to-face time during this encounter.       03/09/2024 1:57 PM Olivia Le  MRN:  [id number]  Chief Complaint: "I have stopped Lamictal , Gabapentin , Intuniv , and Buspar . I continue to struggle with ADHD".  HPI: 33 year old female seen today for follow-up psychiatric evaluation.  She has a psychiatric history of anxiety, depression, bipolar disorder, ADHD, alcohol dependence (sober 1 year), and SI.  She is managed on  trazodone  50 mg nightly, Lamictal  100 mg, Wellbutrin  450 mg daily, Intuniv  2 mg daily, Seroquel  100 mg nightly, Buspar  30 mg twice daily, and gabapentin  300 mg three times daily.  She informed Clinical research associate that she discontinue all of her medications except Seroquel  and trazodone .  Patient reports that medications are somewhat effective in managing her psychiatric conditions.  Today she was well-groomed, pleasant, cooperative, and engaged in conversation.  She informed Clinical research associate that she discontinued Lamictal , BuSpar , Intuniv , and gabapentin .  Patient notes that her mood continues to be stable and notes that she has minimal anxiety and depression.  She denies symptoms of mania.  Patient informed writer that her sleep has improved.  Today she denies SI/HI/VAH, mania, paranoia.    Since her last visit she informed writer that her anxiety and depression have improved.  Today provider conducted GAD-7 and patient 4, at her last visit she scored a 9.  Provider also conducted PHQ-9 and patient  scored a 4, at her last visit she scored a 7.  She endorses adequate sleep and appetite.    Patient reports that she continues to suffer from symptoms of ADHD.  She reports that she is forgetful, disorganized, inattentive, mentally taxing task, and presents with disorganization.  At patient's last visit provider informed her that a stimulant could be trialed as she is tried Wellbutrin , Strattera , Intuniv , and Qelbree without much success.  Provider informed patient that the stimulant would not be started until she passed a UDS. She endorsed understanding and notes that she was unable to get her urine testing as she always [facility] money.  She reports that she will try to attempt to have this testing done soon.  Patient does not have neurogenic testing on file and was referred to [facility].  Patient informed that writer will be going on maternity leave soon.  Provider discussed starting Adderall 5 mg twice daily will be started to help manage symptoms of ADHD pending a negative UDS.  Provider however informed patient that when she is under the care of another provider she cannot dictate how that provider chooses to treat her inattentiveness.  She endorsed understanding and agreed.  Patient requested that writer reorder labs and she was unable to get them done.  Provider ordered CBC, CMP, lipid panel, HgbA1c, thyroid panel, and LFT.  At this time patient does not wish to restart Intuniv , gabapentin , Lamictal , or BuSpar .  She will continue Seroquel  and trazodone  as prescribed.  Referral to [facility] faxed.  No other concerns noted at this time.        Visit Diagnosis:  No  diagnosis found.     Past Psychiatric History: Anxiety, depression, ADHD, and SI  Past Medical History:  Past Medical History:  Diagnosis Date   Kidney stone    Polycystic disease, ovaries     Past Surgical History:  Procedure Laterality Date   TONSILLECTOMY      Family Psychiatric  History: Brother Depression and mother depression  Family History:  Family History  Problem Relation Age of Onset   Hypertension Mother    Healthy Father     Social History:  Social History   Socioeconomic History   Marital status: Single    Spouse name: Not on file   Number of children: Not on file   Years of education: Not on file   Highest education level: Not on file  Occupational History   Not on file  Tobacco Use   Smoking status: Never   Smokeless tobacco: Never  Vaping Use   Vaping status: Some Days   Substances: THC, CBD   Devices: Jule  Substance and Sexual Activity   Alcohol use: Yes    Comment: weekly   Drug use: No   Sexual activity: Not on file  Other Topics Concern   Not on file  Social History Narrative   Not on file   Social Drivers of Health   Financial Resource Strain: Not on file  Food Insecurity: No Food Insecurity (03/29/2021)   Received from [facility], [facility]   Hunger Vital Sign    Worried About Running Out of Food in the Last Year: Never true    Ran Out of Food in the Last Year: Never true  Transportation Needs: Not on file  Physical Activity: Not on file  Stress: Not on file  Social Connections: Unknown (03/12/2022)   Received from [facility], [facility]   Social Network    Social Network: Not on file    Allergies: No Known Allergies  Metabolic Disorder Labs: Lab Results  Component Value Date   HGBA1C 4.9 03/29/2020   MPG 93.93 03/29/2020   No results found for: "PROLACTIN" Lab Results  Component Value Date   CHOL 149 03/29/2020   TRIG 29 03/29/2020   HDL 97 03/29/2020   CHOLHDL 1.5 03/29/2020   VLDL 6 03/29/2020   LDLCALC 46 03/29/2020   Lab Results  Component Value Date   TSH 2.297 03/29/2020    Therapeutic Level Labs: No results found for: "LITHIUM" No results found for: "VALPROATE" No results found for: "CBMZ"  Current Medications: Current Outpatient Medications  Medication Sig Dispense  Refill   Ascorbic Acid (VITAMIN C WITH ROSE HIPS) 500 MG tablet Take 500 mg by mouth daily.     buPROPion  HCl ER, XL, 450 MG TB24 Take 1 tablet (450 mg total) by mouth daily. 30 tablet 3   busPIRone  (BUSPAR ) 30 MG tablet Take 2 tablets (60 mg total) by mouth 2 (two) times daily. 60 tablet 3   cholecalciferol (VITAMIN D3) 25 MCG (1000 UNIT) tablet Take 1,000 Units by mouth daily.     gabapentin  (NEURONTIN ) 300 MG capsule Take 1 capsule (300 mg total) by mouth 3 (three) times daily. 270 capsule 3   guanFACINE  (INTUNIV ) 2 MG TB24 ER tablet Take 1 tablet (2 mg total) by mouth daily. 90 tablet 2   HYDROcodone -acetaminophen  (NORCO/VICODIN) 5-325 MG tablet Take 1-2 tablets by mouth every 6 (six) hours as needed. 15 tablet 0   lamoTRIgine  (LAMICTAL ) 100 MG tablet Take 1 tablet (100 mg total) by mouth daily. 30  tablet 3   QUEtiapine  (SEROQUEL ) 100 MG tablet TAKE 1 TABLET BY MOUTH EVERY DAY AT NIGHT 90 tablet 1   No current facility-administered medications for this visit.     Musculoskeletal: Strength & Muscle Tone: within normal limits and Telehealth visit Gait & Station: normal, Telehealth visit Patient leans: N/A  Psychiatric Specialty Exam: Review of Systems  There were no vitals taken for this visit.There is no height or weight on file to calculate BMI.  General Appearance: Well Groomed  Eye Contact:  Good  Speech:  Clear and Coherent and Normal Rate  Volume:  Normal  Mood:  Euthymic  Affect:  Congruent  Thought Process:  Coherent, Goal Directed and Linear  Orientation:  Full (Time, Place, and Person)  Thought Content: WDL and Logical   Suicidal Thoughts:  No  Homicidal Thoughts:  No  Memory:  Immediate;   Good Recent;   Good Remote;   Good  Judgement:  Good  Insight:  Good  Psychomotor Activity:  Normal  Concentration:  Concentration: Fair and Attention Span: Fair  Recall:  Good  Fund of Knowledge: Good  Language: Good  Akathisia:  No  Handed:  Right  AIMS (if indicated):  Not done  Assets:  Communication Skills Desire for Improvement Financial Resources/Insurance Housing Social Support  ADL's:  Intact  Cognition: WNL  Sleep:  Good   Screenings: AIMS    Flowsheet Row [facility] (Discharged[facility]  AIMS Total Score 0      AUDIT    Flowsheet Row Admission (Discharged) [facility]  Alcohol Use Disorder Identification Test Final Score (AUDIT) 10      GAD-7    Flowsheet Row Video Visit from 12/16/2023 in [facility] Video Visit from 10/15/2023 in [facility] Video Visit from 02/11/2023 in [facility] Video Visit from 12/03/2022 in [facility] Video Visit from 10/22/2022 in [facility]  Total GAD-7 Score 9 10 6 9 16       PHQ2-9    Flowsheet Row Video Visit from 12/16/2023 in [facility] Video Visit from 10/15/2023 in [facility] Video Visit from 02/11/2023 in [facility] Video Visit from 12/03/2022 in [facility] Video Visit from 10/22/2022 in [facility]  PHQ-2 Total Score 3 5 1 3 4   PHQ-9 Total Score 7 11 10 10 18       Flowsheet Row ED from 01/22/2024 in [facility] Video Visit from 10/22/2022 in [facility] Video Visit from 01/02/2021 in [facility]  C-SSRS RISK CATEGORY No Risk Error: Q7 should not be populated when Q6 is No Error: Q7 should not be populated when Q6 is No        Assessment and Plan: Patient reports that her anxiety and depression has improve. She does note that her ADHD continues to be problematic.Patient informed  that writer will be going on maternity leave soon.  Provider discussed starting Adderall 5 mg twice daily will be started to help manage symptoms of ADHD pending a negative UDS.  Provider however informed patient that when she is under the care of another provider she cannot dictate how that provider chooses to treat her inattentiveness.  She endorsed  understanding and agreed.  Patient requested that writer reorder labs and she was unable to get them done.  Provider ordered CBC, CMP, lipid panel, HgbA1c, thyroid panel, and LFT.  At this time patient does not wish to restart Intuniv , gabapentin , Lamictal , or BuSpar .  She will continue Seroquel  and trazodone  as prescribed.  [facility]erral to [facility] faxed.   1. Bipolar 2 disorder, major depressive episode (HCC)  - Comprehensive metabolic panel with GFR; Future - Comprehensive Metabolic Panel (CMET); Future - HgB A1c; Future Continue- QUEtiapine  (SEROQUEL ) 100 MG tablet; TAKE 1 TABLET BY MOUTH EVERY DAY AT NIGHT  Dispense: 90 tablet; Refill: 1 Continue- traZODone  (DESYREL ) 50 MG tablet; Take 1 tablet (50 mg total) by mouth at bedtime as needed for sleep.  Dispense: 30 tablet; Refill: 3 - Urine Drug Panel 7; Future - Hepatic function panel; Future - Thyroid Panel With TSH; Future - Prolactin; Future - Prolactin - Thyroid Panel With TSH - Hepatic function panel - Urine Drug Panel 7 - HgB A1c - Comprehensive Metabolic Panel (CMET) - Comprehensive metabolic panel with GFR  Follow-up in 2 months  Follow-up with therapy   Arlyne Bering, NP 03/09/2024, 1:57 PM

## 2024-03-10 ENCOUNTER — Telehealth (HOSPITAL_COMMUNITY): Payer: Self-pay | Admitting: Psychiatry

## 2024-03-12 ENCOUNTER — Other Ambulatory Visit (HOSPITAL_COMMUNITY): Payer: Self-pay | Admitting: Psychiatry

## 2024-03-12 DIAGNOSIS — F900 Attention-deficit hyperactivity disorder, predominantly inattentive type: Secondary | ICD-10-CM

## 2024-06-01 NOTE — Progress Notes (Unsigned)
 Psychiatric Initial Adult Assessment   Patient Identification: Olivia Le MRN:  [id number] Date of Evaluation:  06/02/2024 Referral Source: Niki Bach Chief Complaint:   Chief Complaint  Patient presents with   Establish Care   Visit Diagnosis:    ICD-10-CM   1. GAD (generalized anxiety disorder)  F41.1 QUEtiapine  (SEROQUEL ) 100 MG tablet    escitalopram  (LEXAPRO ) 10 MG tablet    buPROPion  (WELLBUTRIN  XL) 300 MG 24 hr tablet    2. Moderate episode of recurrent major depressive disorder (HCC)  F33.1 QUEtiapine  (SEROQUEL ) 100 MG tablet    escitalopram  (LEXAPRO ) 10 MG tablet    buPROPion  (WELLBUTRIN  XL) 300 MG 24 hr tablet    3. History of ADHD  Z86.59       Assessment:  Olivia Le is a 33 y.o. female with a psychiatric history of anxiety, depression, bipolar disorder, ADHD, alcohol dependence (sober since 2022), and SI who presents virtually to [facility] at [facility] for initial evaluation on 06/02/2024.  At initial evaluation patient reports symptoms of hopelessness, decreased appetite, isolation, fatigue, excessive worry, restlessness, and feelings of being easily overwhelmed.  She denies any SI/HI or thoughts of self-harm.  She denied any history of mania, hypomania, paranoia, delusions, or psychosis.  Patient does carry a history of alcohol use disorder and has been sober since 23.  She also had a diagnosis of ADHD in childhood.  Patient met criteria for MDD and GAD today.  Neuropsych testing referral was placed for further clarification on ADHD diagnosis.  Risk Assessment: A suicide and violence risk assessment was performed as part of this evaluation. There patient is deemed to be at chronic elevated risk for self-harm/suicide given the following factors: feelings of hopelessness. These risk factors are mitigated by the following factors: lack of active SI/HI, no known access to weapons or firearms, no history of previous suicide attempts,  presence of an available support system, and expresses purpose for living. The patient is deemed to be at chronic elevated risk for violence given the following factors: N/A. These risk factors are mitigated by the following factors: N/A. There is no acute risk for suicide or violence at this time. The patient was educated about relevant modifiable risk factors including following recommendations for treatment of psychiatric illness and abstaining from substance abuse.  While future psychiatric events cannot be accurately predicted, the patient does not currently require  acute inpatient psychiatric care and does not currently meet [place]  involuntary commitment criteria.  Patient was given contact information for crisis resources, behavioral health clinic and was instructed to call 911 for emergencies.     Plan: # MDD/GAD Past medication trials:  Status of problem: Ongoing Interventions: - Restart Seroquel  100 mg - Start Lexapro  10 mg daily - Start Bupropion  XL 300 mg daily - Discontinue Trazodone  50 mg - Disontinue - CMP, LFT, TSH, prolactin, A1c, Utox pending  - Lipid panel ordered  #Hx ADHD Past medication trials: Intuniv , Strattera ,Qelbree, Adderall, and Vyvanse (most effective) Status of problem: Ongoing Interventions: -- [facility]psych [facility],   History of Present Illness:  Olivia Le presents for initial eval follow transfer from [facility].  She reports that since her last visit she has continued to struggle with symptoms of ADHD in addition to an increase in anxiety/depressive symptoms.  Patient notes that she had been overwhelmed with the various medications she was taking and had likely stopped them prematurely.  In high and cite some of her prior medications had  likely been helping.  She lists the Lexapro  specifically as having made her feel better in the past.  Olivia Le had also mentioned that Seroquel  had been more  effective for her insomnia compared to the trazodone .  She had been on both however due to miscommunication only the trazodone  was refilled at her last appointment.  She feels that the Seroquel  had actually more effective and unnecessary.  We reviewed patient's past psychiatric history and she endorses a diagnosis of anxiety, depression, alcohol use disorder in remission since 2022, and history of ADHD.  ADHD is an ongoing issue and she discusses difficulties with concentration, focus, and organizing tasks.  She had been diagnosed in high school via testing and prescribed medications.  Patient notes that Vyvanse had been the most helpful in the past.  We discussed the need for neuropsych testing to start a stimulant medication.  Patient expressed her understanding and was open to referral.  She will also look for records from her prior testing.  In regards to the anxiety and depression patient feels that there are symptoms overlap.  They first started at the age of 59 and were made worse in the context of her alcohol use.  She currently describes symptoms of hopelessness, decreased appetite, isolation, fatigue, excessive worry, restlessness, and feelings of being easily overwhelmed.  She denies any SI/HI or thoughts of self-harm.  She last experienced suicidal ideation in 2022 with no reoccurrence since.  Previously the suicidal ideation was often in the context of alcohol use.  Patient has been sober since 12.  She had attended a [facility]-day program [facility] followed by AA and the 12-step program.  She still attends AA meetings twice a week.  She denies any cravings or urge for relapse.  Of note patient did have a past diagnosis of bipolar 2 listed in the chart.  On review today she denied any symptoms of hypomania or mania.  Patient also denied any psychosis, paranoia, delusions, or PTSD.  Treatment options were reviewed and patient is interested in restarting Lexapro , Seroquel , and getting on ADHD  medication.  She denies experiencing any adverse side effects from the Lexapro  or Seroquel  in the past.  After discussion of risks and benefits we were open to restart the Lexapro  10 mg and Seroquel  100 mg.  Furthermore we will discontinue the trazodone .  Of note patient had self started an old prescription of her Wellbutrin  and BuSpar .  After discussion plan was made to discontinue BuSpar  and decrease the Wellbutrin  to 300 mg daily.  Had discussed therapy but patient did not feel like it was necessary at this time.  Past Psychiatric History:  Past psychiatric diagnoses: MDD, GAD, Bipolar 2 disorder, AUD, self reported ADHD Psychiatric hospitalizations:In may of 2021 in the context of depression with SI and AUD Past suicide attempts: Denies Hx of self harm: Denies Hx of violence towards others: Denies Prior psychiatric providers: Niki Bach, [facility], [facility] Prior therapy: Pa[facility] Access to firearms: Denies  Prior medication trials:  Wellbutrin  450 mg daily, Strattera , Intuniv  2 mg daily, and Qelbree (all had limited benefit for ADHD), Lamictal  100 mg, Buspar  30 mg twice daily, and gabapentin  300 mg three times daily discontinued the latter 3 due to improvement in mood symptoms. Lexapro , venlafaxine, Remeron , atarax , propranolol , naltrexone   Substance use:  ETOH First Use: early 54s. Has used 3-4 times a week with 3-4 drinks per occasion in the past. Reports that she has been sober since 2022. [facility],  and AA meetings. Now attends meetings twice a week.  Past Medical History:  Past Medical History:  Diagnosis Date   Kidney stone    Polycystic disease, ovaries     Past Surgical History:  Procedure Laterality Date   TONSILLECTOMY      Family Psychiatric History: Brother Depression and mother depression  Family History:  Family History  Problem Relation Age of Onset   Hypertension Mother    Healthy Father     Social History:   Social  History   Socioeconomic History   Marital status: Single    Spouse name: Not on file   Number of children: Not on file   Years of education: Not on file   Highest education level: Not on file  Occupational History   Not on file  Tobacco Use   Smoking status: Never   Smokeless tobacco: Never  Vaping Use   Vaping status: Some Days   Substances: THC, CBD   Devices: Jule  Substance and Sexual Activity   Alcohol use: Yes    Comment: weekly   Drug use: No   Sexual activity: Not on file  Other Topics Concern   Not on file  Social History Narrative   Not on file   Social Drivers of Health   Financial Resource Strain: Not on file  Food Insecurity: No Food Insecurity (03/29/2021)   [facility] from [facility]   Hunger Vital Sign    Within the past 12 months, you worried that your food would run out before you got the money to buy more.: Never true    Within the past 12 months, the food you bought just didn't last and you didn't have money to get more.: Never true  Transportation Needs: Not on file  Physical Activity: Not on file  Stress: Not on file  Social Connections: Unknown (03/12/2022)   Received from [facility]   Social Network    Social Network: Not on file    Additional Social History: Currently lives in [place] and lives on her own with 2 dogs. 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 Likes to take her dogs on trips, house projects, playing guitar. Has a partner and family here. College at [employer] got her bachelors degree.   Allergies:  No Known Allergies  Metabolic Disorder Labs: Lab Results  Component Value Date   HGBA1C 4.9 03/29/2020   MPG 93.93 03/29/2020   No results found for: PROLACTIN Lab Results  Component Value Date   CHOL 149 03/29/2020   TRIG 29 03/29/2020   HDL 97 03/29/2020   CHOLHDL 1.5 03/29/2020   VLDL 6 03/29/2020   LDLCALC 46 03/29/2020   Lab Results  Component Value Date   TSH 2.297  03/29/2020    Therapeutic Level Labs: No results found for: LITHIUM No results found for: CBMZ No results found for: VALPROATE  Current Medications: Current Outpatient Medications  Medication Sig Dispense Refill   buPROPion  (WELLBUTRIN  XL) 300 MG 24 hr tablet Take 1 tablet (300 mg total) by mouth every morning. 30 tablet 2   escitalopram  (LEXAPRO ) 10 MG tablet Take 1 tablet (10 mg total) by mouth daily. 90 tablet 0   Ascorbic Acid (VITAMIN C WITH ROSE HIPS) 500 MG tablet Take 500 mg by mouth daily.     cholecalciferol (VITAMIN D3) 25 MCG (1000 UNIT) tablet Take 1,000 Units by mouth daily.     HYDROcodone -acetaminophen  (NORCO/VICODIN) 5-325 MG tablet Take 1-2 tablets by  mouth every 6 (six) hours as needed. 15 tablet 0   QUEtiapine  (SEROQUEL ) 100 MG tablet TAKE 1 TABLET BY MOUTH EVERY DAY AT NIGHT 90 tablet 0   No current facility-administered medications for this visit.    Psychiatric Specialty Exam:  Psychiatric Specialty Exam: There were no vitals taken for this visit.There is no height or weight on file to calculate BMI.  General Appearance: Fairly Groomed  Eye Contact:  Good  Speech:  Clear and Coherent  Volume:  Normal  Mood:  Anxious, Depressed, and Euthymic  Affect:  Appropriate  Thought Content: Logical   Suicidal Thoughts:  No  Homicidal Thoughts:  No  Thought Process:  Coherent  Orientation:  Full (Time, Place, and Person)    Memory: Immediate;   Fair  Judgment:  Fair  Insight:  Fair  Concentration:  Concentration: Fair  Recall:  not formally assessed   Fund of Knowledge: Fair  Language: Good  Psychomotor Activity:  Normal  Akathisia:  NA  AIMS (if indicated): not done  Assets:  Communication Skills Desire for Improvement Financial Resources/Insurance Housing  ADL's:  Intact  Cognition: WNL  Sleep:  Good    Screenings: AIMS    Flowsheet Row [facility]   AIMS Total Score 0   AUDIT    Flowsheet Row Admission (Discharged) [facility]  Alcohol Use Disorder Identification Test Final Score (AUDIT) 10   GAD-7    Flowsheet Row Video Visit from 03/09/2024 in [facility] Video Visit from 12/16/2023 in [facility] Video Visit from 10/15/2023 in [facility] Video Visit from 02/11/2023 in [facility] Video Visit from 12/03/2022 in [facility]  Total GAD-7 Score 4 9 10 6 9    PHQ2-9    Flowsheet Row Video Visit from 03/09/2024 in [facility] Video Visit from 12[facility]16/2023 in [facility] Video Visit from 10/15/2023 in [facility] Video Visit from 02/11/2023 in [facility] Video Visit from 12/03/2022 in [facility]  PHQ-2 Total Score 1 3 5 1 3   PHQ-9 Total Score 4 7 11 10 10    Flowsheet Row ED from 01/22/2024 in [facility] Video Visit from 10/22/2022 in [facility] Video Visit from 01/02/2021 in [facility]  C-SSRS RISK CATEGORY No Risk Error: Q7 should not be populated when Q6 is No Error: Q7 should not be populated when Q6 is No     Collaboration of Care: Medication Management AEB medication prescription and Psychiatrist AEB chart review  Patient/Guardian was advised Release of Information must be obtained prior to any record release in order to collaborate their care with an outside provider. Patient/Guardian was advised if they have not already done so to contact the registration department to sign all necessary forms in order for us  to release information regarding their care.   Consent: Patient/Guardian gives  verbal consent for treatment and assignment of benefits for services provided during this visit. Patient/Guardian expressed understanding and agreed to proceed.   Olivia CHRISTELLA Finder, MD 7/31/20251:54 PM    Virtual Visit via Video Note  I connected with Olivia Le on 06/02/24 at  1:00 PM EDT by a video enabled telemedicine application and verified  that I am speaking with the correct person using two identifiers.  Location: Patient: In her car Provider: Home office   I discussed the limitations of evaluation and management by telemedicine and the availability of in person appointments. The patient expressed understanding and agreed to proceed.   I discussed the assessment and treatment plan with the patient. The patient was provided an opportunity to ask questions and all were answered. The patient agreed with the plan and demonstrated an understanding of the instructions.   The patient was advised to call back or seek an in-person evaluation if the symptoms worsen or if the condition fails to improve as anticipated.  I provided 30 minutes of non-face-to-face time during this encounter.   Olivia CHRISTELLA Finder, MD

## 2024-06-02 ENCOUNTER — Encounter (HOSPITAL_COMMUNITY): Payer: Self-pay | Admitting: Psychiatry

## 2024-06-02 ENCOUNTER — Ambulatory Visit (HOSPITAL_COMMUNITY): Admitting: Psychiatry

## 2024-06-02 DIAGNOSIS — F329 Major depressive disorder, single episode, unspecified: Secondary | ICD-10-CM

## 2024-06-02 DIAGNOSIS — F411 Generalized anxiety disorder: Secondary | ICD-10-CM | POA: Diagnosis not present

## 2024-06-02 DIAGNOSIS — F331 Major depressive disorder, recurrent, moderate: Secondary | ICD-10-CM

## 2024-06-02 DIAGNOSIS — Z8659 Personal history of other mental and behavioral disorders: Secondary | ICD-10-CM | POA: Diagnosis not present

## 2024-06-02 MED ORDER — QUETIAPINE FUMARATE 100 MG PO TABS: ORAL_TABLET | ORAL | 0 refills | Status: DC

## 2024-06-02 MED ORDER — BUPROPION HCL ER (XL) 300 MG PO TB24: 300.0000 mg | ORAL_TABLET | ORAL | 2 refills | Status: DC

## 2024-06-02 MED ORDER — ESCITALOPRAM OXALATE 10 MG PO TABS: 10.0000 mg | ORAL_TABLET | Freq: Every day | ORAL | 0 refills | Status: DC

## 2024-07-20 NOTE — Progress Notes (Unsigned)
 BH MD/PA/NP OP Progress Note  07/21/2024 10:46 AM Olivia Le  MRN:  [id number]  Visit Diagnosis:    ICD-10-CM   1. GAD (generalized anxiety disorder)  F41.1 buPROPion  (WELLBUTRIN  XL) 300 MG 24 hr tablet    escitalopram  (LEXAPRO ) 20 MG tablet    QUEtiapine  (SEROQUEL ) 100 MG tablet    2. Moderate episode of recurrent major depressive disorder (HCC)  F33.1 buPROPion  (WELLBUTRIN  XL) 300 MG 24 hr tablet    escitalopram  (LEXAPRO ) 20 MG tablet    QUEtiapine  (SEROQUEL ) 100 MG tablet    3. History of ADHD  Z86.59 atomoxetine  (STRATTERA ) 40 MG capsule    4. Encounter for long-term (current) use of medications  Z79.899 Lipid panel      Assessment: Olivia Le is a 33 y.o. female with a psychiatric history of anxiety, depression, bipolar disorder, ADHD, alcohol dependence (sober since 2022), and SI who presents virtually to [facility] for initial evaluation on 06/02/2024.  At initial evaluation patient reports symptoms of hopelessness, decreased appetite, isolation, fatigue, excessive worry, restlessness, and feelings of being easily overwhelmed.  She denies any SI/HI or thoughts of self-harm.  She denied any history of mania, hypomania, paranoia, delusions, or psychosis.  Patient does carry a history of alcohol use disorder and has been sober since 65.  She also had a diagnosis of ADHD in childhood.  Patient met criteria for MDD and GAD today.  Neuropsych testing referral was placed for further clarification on ADHD diagnosis.  Olivia Le presents for follow-up evaluation. Today, 07/21/24, patient reports improvement in anxiety symptoms.  Depression remains persistent however with symptoms of amotivation, anhedonia, and fatigue.  Notably there had been an issue with her pharmacy and picking up the Wellbutrin .  Will resend Wellbutrin  and titrate Lexapro  to 20 mg today.  Furthermore discussed starting atomoxetine  to manage concentration/focus symptoms  while patient awaits neuropsychiatric testing.  We will follow-up in 2 months.  Patient will get lipid panel in the interim.  Risk Assessment: An assessment of suicide and violence risk factors was performed as part of this evaluation and is not significantly changed from the last visit. While future psychiatric events cannot be accurately predicted, the patient does not currently require acute inpatient psychiatric care and does not currently meet   involuntary commitment criteria. Patient was given contact information for crisis resources, behavioral health clinic and was instructed to call 911 for emergencies.   Plan: # MDD/GAD Past medication trials:  Status of problem: Ongoing Interventions: - Continue Seroquel  100 mg - Continue Lexapro  10 mg daily - Continue Bupropion  XL 300 mg daily - Discontinue Trazodone  50 mg - Disontinue - CMP, LFT, TSH, prolactin, A1c, Utox pending  - Lipid panel ordered  #Hx ADHD Past medication trials: Intuniv , Strattera ,Qelbree, Adderall, and Vyvanse (most effective) Status of problem: Ongoing Interventions: -- [facility]psych [facility] - Start Strattera  40 mg daily  #AUD in remission Past medication trials:  Status of problem: Controlled Interventions: --Continue to attend AA meetings twice a week  Chief Complaint:  Chief Complaint  Patient presents with   Follow-up   HPI: Olivia Le reports the last month things have been all right.  The anxiety has improved with the reinitiation of Lexapro  although depression feels largely unchanged.  She still feels like a large cloud of sitting over her and lacks the motivation to get up and do things.  Of note there was an issue with picking up the medications from the pharmacy and  she did not start Wellbutrin .  It is unclear what led to this as all medications had been sent to the correct pharmacy however we did resend them today.  She denies any adverse  side effects from restarting Lexapro  or Seroquel  and insomnia has improved.  Patient was interested in titrating the Lexapro  back to 20 mg which he agreed would be appropriate.  Furthermore she questioned whether nonstimulant medication would be an option for ADHD symptoms.  Discussed risk and benefits of Strattera  as well as her past experience.  Patient had felt Strattera  was beneficial in the past though wore off over time.  Would be appropriate to start Strattera  40 mg a day while patient awaits neuropsych testing.  She has received the paperwork and needs to complete it and send it back in for appointment.  Past Psychiatric History:  Past psychiatric diagnoses: MDD, GAD, Bipolar 2 disorder, AUD, self reported ADHD Psychiatric hospitalizations:In may of 2021 in the context of depression with SI and AUD Past suicide attempts: Denies Hx of self harm: Denies Hx of violence towards others: Denies Prior psychiatric providers: Olivia Le, Olivia Le, [facility] Prior therapy: Olivia Le Access to firearms: Denies  Prior medication trials:  Wellbutrin  450 mg daily, Strattera , Intuniv  2 mg daily, and Qelbree (all had limited benefit for ADHD), Lamictal  100 mg, Buspar  30 mg twice daily, and gabapentin  300 mg three times daily discontinued the latter 3 due to improvement in mood symptoms. Lexapro , venlafaxine, Remeron , atarax , propranolol , naltrexone   Substance use:  ETOH First Use: early 21s. Has used 3-4 times a week with 3-4 drinks per occasion in the past. Reports that she has been sober since 2022. She maintained sobriety through [facility], and [employer] Now attends meetings twice a week.   Past Medical History:  Past Medical History:  Diagnosis Date   Kidney stone    Polycystic disease, ovaries     Past Surgical History:  Procedure Laterality Date   TONSILLECTOMY     Family History:  Family History  Problem Relation Age of Onset   Hypertension Mother    Healthy Father      Social History:  Social History   Socioeconomic History   Marital status: Single    Spouse name: Not on file   Number of children: Not on file   Years of education: Not on file   Highest education level: Not on file  Occupational History   Not on file  Tobacco Use   Smoking status: Never   Smokeless tobacco: Never  Vaping Use   Vaping status: Some Days   Substances: THC, CBD   Devices: Jule  Substance and Sexual Activity   Alcohol use: Yes    Comment: weekly   Drug use: No   Sexual activity: Not on file  Other Topics Concern   Not on file  Social History Narrative   Not on file   Social Drivers of Health   Financial Resource Strain: Not on file  Food Insecurity: No Food Insecurity (03/29/2021)   [facility]   Hunger Vital Sign    Within the past 12 months, you worried that your food would run out before you got the money to buy more.: Never true    Within the past 12 months, the food you bought just didn't last and you didn't have money to get more.: Never true  Transportation Needs: Not on file  Physical Activity: Not on file  Stress: Not on file  Social Connections: Unknown (03/12/2022)  [facility]   Social Network    Social Network: Not on file    Allergies: No Known Allergies  Current Medications: Current Outpatient Medications  Medication Sig Dispense Refill   atomoxetine  (STRATTERA ) 40 MG capsule Take 1 capsule (40 mg total) by mouth daily. 30 capsule 2   Ascorbic Acid (VITAMIN C WITH ROSE HIPS) 500 MG tablet Take 500 mg by mouth daily.     buPROPion  (WELLBUTRIN  XL) 300 MG 24 hr tablet Take 1 tablet (300 mg total) by mouth every morning. 30 tablet 2   cholecalciferol (VITAMIN D3) 25 MCG (1000 UNIT) tablet Take 1,000 Units by mouth daily.     escitalopram  (LEXAPRO ) 20 MG tablet Take 1 tablet (20 mg total) by mouth daily. 90 tablet 0   HYDROcodone -acetaminophen  (NORCO/VICODIN) 5-325 MG tablet Take 1-2 tablets by mouth  every 6 (six) hours as needed. 15 tablet 0   QUEtiapine  (SEROQUEL ) 100 MG tablet TAKE 1 TABLET BY MOUTH EVERY DAY AT NIGHT 90 tablet 0   No current facility-administered medications for this visit.     Psychiatric Specialty Exam: There were no vitals taken for this visit.There is no height or weight on file to calculate BMI. Review of Systems  General Appearance: Fairly Groomed  Eye Contact:  Good  Speech:  Clear and Coherent  Volume:  Normal  Mood:  Euthymic and dysthymic  Affect:  Appropriate  Thought Content: Logical   Suicidal Thoughts:  No  Homicidal Thoughts:  No  Thought Process:  Coherent and Goal Directed  Orientation:  Full (Time, Place, and Person)    Memory: Immediate;   Fair  Judgment:  Fair  Insight:  Good  Concentration:  Concentration: Fair  Recall:  not formally assessed   Fund of Knowledge: Good  Language: Good  Psychomotor Activity:  Normal  Akathisia:  No  AIMS (if indicated): not done  Assets:  Communication Skills Desire for Improvement Financial Resources/Insurance Housing  ADL's:  Intact  Cognition: WNL  Sleep:  Good   Metabolic Disorder Labs: Lab Results  Component Value Date   HGBA1C 4.9 03/29/2020   MPG 93.93 03/29/2020   No results found for: PROLACTIN Lab Results  Component Value Date   CHOL 149 03/29/2020   TRIG 29 03/29/2020   HDL 97 03/29/2020   CHOLHDL 1.5 03/29/2020   VLDL 6 03/29/2020   LDLCALC 46 03/29/2020   Lab Results  Component Value Date   TSH 2.297 03/29/2020    Therapeutic Level Labs: No results found for: LITHIUM No results found for: VALPROATE No results found for: CBMZ   Screenings: AIMS    Flowsheet Row [facility] (Dis[facility]  AIMS Total Score 0   AUDIT    Flowsheet Row Admission (Discharged) from [facility]  Alcohol Use Disorder Identification Test  Final Score (AUDIT) 10   GAD-7    Flowsheet Row Video Visit from 03/09/2024 in [facility] Video Visit from 12/16/2023 in [facility] Video Visit from 10/15/2023 in [facility] Video Visit from 02/11/2023 in [facility] Video Visit from 12/03/2022 in [facility]  Total GAD-7 Score 4 9 10 6 9    PHQ2-9    Flowsheet Row Video Visit from 03/09/2024 in [facility] Video Visit from 12/16/2023 in [facility] Video  Visit from 10/15/2023 in [facility] Video Visit from 02/11/2023 in [facility] Video Visit from 12/03/2022 in [facility]  PHQ-2 Total Score 1 3 5 1 3   PHQ-9 Total Score 4 7 11 10 10    Flowsheet Row ED from 01/22/2024 in [facility] Video Visit from 10/22/2022 in [facility] Video Visit from 01/02/2021 in [facility]  C-SSRS RISK CATEGORY No Risk Error: Q7 should not be populated when Q6 is No Error: Q7 should not be populated when Q6 is No    Collaboration of Care: Collaboration of Care: Medication Management AEB medication prescription  Patient/Guardian was advised Release of Information must be obtained prior to any record release in order to collaborate their care with an outside provider. Patient/Guardian was advised if they have not already done so to contact the registration department to sign all necessary forms in order for us  to release information regarding their care.   Consent: Patient/Guardian gives verbal consent for treatment and assignment of benefits for services provided during this visit. Patient/Guardian expressed understanding and agreed to proceed.    Arvella CHRISTELLA Finder, MD 07/21/2024, 10:46  AM   Virtual Visit via Video Note  I connected with Truman Pali on 07/21/24 at 10:30 AM EDT by a video enabled telemedicine application and verified that I am speaking with the correct person using two identifiers.  Location: Patient: Home Provider: Home Office   I discussed the limitations of evaluation and management by telemedicine and the availability of in person appointments. The patient expressed understanding and agreed to proceed.   I discussed the assessment and treatment plan with the patient. The patient was provided an opportunity to ask questions and all were answered. The patient agreed with the plan and demonstrated an understanding of the instructions.   The patient was advised to call back or seek an in-person evaluation if the symptoms worsen or if the condition fails to improve as anticipated.  I provided 10 minutes of non-face-to-face time during this encounter.   Arvella CHRISTELLA Finder, MD

## 2024-07-21 ENCOUNTER — Encounter (HOSPITAL_COMMUNITY): Payer: Self-pay | Admitting: Psychiatry

## 2024-07-21 ENCOUNTER — Telehealth (HOSPITAL_BASED_OUTPATIENT_CLINIC_OR_DEPARTMENT_OTHER): Admitting: Psychiatry

## 2024-07-21 DIAGNOSIS — Z79899 Other long term (current) drug therapy: Secondary | ICD-10-CM | POA: Diagnosis not present

## 2024-07-21 DIAGNOSIS — F331 Major depressive disorder, recurrent, moderate: Secondary | ICD-10-CM | POA: Diagnosis not present

## 2024-07-21 DIAGNOSIS — F411 Generalized anxiety disorder: Secondary | ICD-10-CM | POA: Diagnosis not present

## 2024-07-21 DIAGNOSIS — Z8659 Personal history of other mental and behavioral disorders: Secondary | ICD-10-CM

## 2024-07-21 MED ORDER — QUETIAPINE FUMARATE 100 MG PO TABS
ORAL_TABLET | ORAL | 0 refills | Status: DC
Start: 2024-07-21 — End: 2024-09-15

## 2024-07-21 MED ORDER — ATOMOXETINE HCL 40 MG PO CAPS: 40.0000 mg | ORAL_CAPSULE | Freq: Every day | ORAL | 2 refills | Status: DC

## 2024-07-21 MED ORDER — ESCITALOPRAM OXALATE 20 MG PO TABS: 20.0000 mg | ORAL_TABLET | Freq: Every day | ORAL | 0 refills | Status: DC

## 2024-07-21 MED ORDER — BUPROPION HCL ER (XL) 300 MG PO TB24: 300.0000 mg | ORAL_TABLET | ORAL | 2 refills | Status: DC

## 2024-07-23 ENCOUNTER — Other Ambulatory Visit (HOSPITAL_COMMUNITY): Payer: Self-pay | Admitting: Psychiatry

## 2024-07-23 DIAGNOSIS — F3181 Bipolar II disorder: Secondary | ICD-10-CM

## 2024-08-19 ENCOUNTER — Other Ambulatory Visit (HOSPITAL_COMMUNITY): Payer: Self-pay | Admitting: Psychiatry

## 2024-08-19 DIAGNOSIS — F411 Generalized anxiety disorder: Secondary | ICD-10-CM

## 2024-08-19 DIAGNOSIS — Z8659 Personal history of other mental and behavioral disorders: Secondary | ICD-10-CM

## 2024-08-19 DIAGNOSIS — F331 Major depressive disorder, recurrent, moderate: Secondary | ICD-10-CM

## 2024-08-19 DIAGNOSIS — F3181 Bipolar II disorder: Secondary | ICD-10-CM

## 2024-09-12 NOTE — Progress Notes (Unsigned)
 BH MD/PA/NP OP Progress Note  09/15/2024 1:23 PM Olivia Le  MRN:  [id number]  Visit Diagnosis:    ICD-10-CM   1. History of ADHD  Z86.59 atomoxetine  (STRATTERA ) 80 MG capsule    2. GAD (generalized anxiety disorder)  F41.1 FLUoxetine (PROZAC) 10 MG capsule    QUEtiapine  (SEROQUEL ) 100 MG tablet    buPROPion  (WELLBUTRIN  XL) 300 MG 24 hr tablet    3. Moderate episode of recurrent major depressive disorder (HCC)  F33.1 FLUoxetine (PROZAC) 10 MG capsule    QUEtiapine  (SEROQUEL ) 100 MG tablet    buPROPion  (WELLBUTRIN  XL) 300 MG 24 hr tablet       Assessment: Olivia Le is a 33 y.o. female with a psychiatric history of anxiety, depression, bipolar disorder, ADHD, alcohol dependence (sober since 2022), and SI who presents virtually to [facility] for initial evaluation on 06/02/2024.  At initial evaluation patient reports symptoms of hopelessness, decreased appetite, isolation, fatigue, excessive worry, restlessness, and feelings of being easily overwhelmed.  She denies any SI/HI or thoughts of self-harm.  She denied any history of mania, hypomania, paranoia, delusions, or psychosis.  Patient does carry a history of alcohol use disorder and has been sober since 7.  She also had a diagnosis of ADHD in childhood.  Patient met criteria for MDD and GAD today.  Neuropsych testing referral was placed for further clarification on ADHD diagnosis.  Olivia Le presents for follow-up evaluation. Today, 09/15/24, patient anxiety symptoms remain stable with some improvement in concentration following initiation of atomoxetine .  Depression symptoms remain ongoing with amotivation, anhedonia, and fatigue.  Lexapro  had been titrated however patient reported increased affective blunting and discontinued the medication 2 weeks ago.  She was cautioned to reach out to provider before making medication changes.  Will start on Prozac 10 mg daily and reviewed risk and  benefits.  Patient was also reminded to complete metabolic labs.  She is still waiting on neuropsych testing.  Will follow-up in 2 months.  Risk Assessment: An assessment of suicide and violence risk factors was performed as part of this evaluation and is not significantly changed from the last visit. While future psychiatric events cannot be accurately predicted, the patient does not currently require acute inpatient psychiatric care and does not currently meet [place]  involuntary commitment criteria. Patient was given contact information for crisis resources, behavioral health clinic and was instructed to call 911 for emergencies.   Plan: # MDD/GAD Past medication trials:  Status of problem: Ongoing Interventions: - Continue Seroquel  100 mg - Continue Bupropion  XL 300 mg daily - Start Prozac 10 mg daily - Discontinue Lexapro  20 mg daily - CMP, LFT, TSH, prolactin, A1c, Utox pending  - Lipid panel ordered  #Hx ADHD Past medication trials: Intuniv , Strattera ,Qelbree, Adderall, and Vyvanse (most effective) Status of problem: Ongoing Interventions: - Neuropsych testing referral to [facility],  attention specialists - Increase Strattera  80 mg daily  #AUD in remission Past medication trials:  Status of problem: Controlled Interventions: --Continue to attend AA meetings twice a week  Chief Complaint:  Chief Complaint  Patient presents with   Follow-up   HPI: Olivia Le reports ongoing depression.  She discontinued the Lexapro  2 weeks ago as she felt that it was having the opposite effect on her depression.  Patient describes feeling more apathetic or emotionally blunted on the medication that she had off of it.  Since discontinuing Lexapro  2 weeks ago this apathy has gradually improved.  That said ongoing depression symptoms  are still present.  Anxiety symptoms have been well-controlled and there has been some improvement in attention following the initiation of atomoxetine .   Patient denies any adverse side effects from the Wellbutrin , Seroquel , or atomoxetine .  Discussed titration of atomoxetine  today and patient is still waiting on neuropsych testing.  In regards to depressive symptoms we discussed other antidepressant treatment options.  After reviewed patient's only other trials are on venlafaxine and mirtazapine .  She is unable to recall what happened to venlafaxine.  Given that she is hoping to start stimulant medication in the future SSRI medication would be better indicated to minimize excess norepinephrine.  Past Psychiatric History:  Past psychiatric diagnoses: MDD, GAD, Bipolar 2 disorder, AUD, self reported ADHD Psychiatric hospitalizations:In may of 2021 in the context of depression with SI and AUD Past suicide attempts: Denies Hx of self harm: Denies Hx of violence towards others: Denies Prior psychiatric providers: Olivia Le, Olivia Le, [facility] Prior therapy: Olivia Le Access to firearms: Denies  Prior medication trials:  Wellbutrin  450 mg daily, Strattera , Intuniv  2 mg daily, and Qelbree (all had limited benefit for ADHD), Lamictal  100 mg, Buspar  30 mg twice daily, and gabapentin  300 mg three times daily discontinued the latter 3 due to improvement in mood symptoms. Lexapro  (), venlafaxine, Remeron , atarax , propranolol , naltrexone   Substance use:  ETOH First Use: early 98s. Has used 3-4 times a week with 3-4 drinks per occasion in the past. Reports that she has been sober since 2022. She maintained sobriety through [facility], and [employer] Now attends meetings twice a week.   Past Medical History:  Past Medical History:  Diagnosis Date   Kidney stone    Polycystic disease, ovaries     Past Surgical History:  Procedure Laterality Date   TONSILLECTOMY     Family History:  Family History  Problem Relation Age of Onset   Hypertension Mother    Healthy Father     Social History:  Social History   Socioeconomic History    Marital status: Single    Spouse name: Not on file   Number of children: Not on file   Years of education: Not on file   Highest education level: Not on file  Occupational History   Not on file  Tobacco Use   Smoking status: Never   Smokeless tobacco: Never  Vaping Use   Vaping status: Some Days   Substances: THC, CBD   Devices: Jule  Substance and Sexual Activity   Alcohol use: Yes    Comment: weekly   Drug use: No   Sexual activity: Not on file  Other Topics Concern   Not on file  Social History Narrative   Not on file   Social Drivers of Health   Financial Resource Strain: Not on file  Food Insecurity: No Food Insecurity (03/29/2021)   [facility] from [facility]   Hunger Vital Sign    Within the past 12 months, you worried that your food would run out before you got the money to buy more.: Never true    Within the past 12 months, the food you bought just didn't last and you didn't have money to get more.: Never true  Transportation Needs: Not on file  Physical Activity: Not on file  Stress: Not on file  Social Connections: Unknown (03/12/2022)   Received from [employer]   Social Network    Social Network: Not on file    Allergies: No Known Allergies  Current Medications: Current Outpatient Medications  Medication Sig Dispense Refill   FLUoxetine (PROZAC) 10 MG capsule Take 1 capsule (10 mg total) by mouth daily. 30 capsule 2   Ascorbic Acid (VITAMIN C WITH ROSE HIPS) 500 MG tablet Take 500 mg by mouth daily.     atomoxetine  (STRATTERA ) 80 MG capsule Take 1 capsule (80 mg total) by mouth daily. 30 capsule 2   [START ON 11/08/2024] buPROPion  (WELLBUTRIN  XL) 300 MG 24 hr tablet Take 1 tablet (300 mg total) by mouth every morning. 90 tablet 0   cholecalciferol (VITAMIN D3) 25 MCG (1000 UNIT) tablet Take 1,000 Units by mouth daily.     HYDROcodone -acetaminophen  (NORCO/VICODIN) 5-325 MG tablet Take 1-2 tablets by mouth every 6 (six) hours as needed. 15 tablet 0    QUEtiapine  (SEROQUEL ) 100 MG tablet TAKE 1 TABLET BY MOUTH EVERY DAY AT NIGHT 90 tablet 0   No current facility-administered medications for this visit.     Psychiatric Specialty Exam: There were no vitals taken for this visit.There is no height or weight on file to calculate BMI. Review of Systems  General Appearance: Fairly Groomed  Eye Contact:  Good  Speech:  Clear and Coherent  Volume:  Normal  Mood:  Euthymic and dysthymic  Affect:  Appropriate  Thought Content: Logical   Suicidal Thoughts:  No  Homicidal Thoughts:  No  Thought Process:  Coherent and Goal Directed  Orientation:  Full (Time, Place, and Person)    Memory: Immediate;   Fair  Judgment:  Fair  Insight:  Good  Concentration:  Concentration: Fair  Recall:  not formally assessed   Fund of Knowledge: Good  Language: Good  Psychomotor Activity:  Normal  Akathisia:  No  AIMS (if indicated): not done  Assets:  Communication Skills Desire for Improvement Financial Resources/Insurance Housing  ADL's:  Intact  Cognition: WNL  Sleep:  Good   Metabolic Disorder Labs: Lab Results  Component Value Date   HGBA1C 4.9 03/29/2020   MPG 93.93 03/29/2020   No results found for: PROLACTIN Lab Results  Component Value Date   CHOL 149 03/29/2020   TRIG 29 03/29/2020   HDL 97 03/29/2020   CHOLHDL 1.5 03/29/2020   VLDL 6 03/29/2020   LDLCALC 46 03/29/2020   Lab Results  Component Value Date   TSH 2.297 03/29/2020    Therapeutic Level Labs: No results found for: LITHIUM No results found for: VALPROATE No results found for: CBMZ   Screenings: AIMS    Flowsheet Row [facility]Dis[facility]  AIMS Total Score 0   AUDIT    Flowsheet Row Admission (Discharged) from [facility]  Alcohol Use Disorder Identification Test Final Score (AUDIT) 10   GAD-7    Flowsheet  Row Video Visit from 03/09/2024 in [facility] Video Visit from 12/16/2023 in [facility] Video Visit from 10/15/2023 in [facility] Video Visit from 02/11/2023 in [facility] Video Visit from 12/03/2022 in [facility]  Total GAD-7 Score 4 9 10 6 9    PHQ2-9    Flowsheet Row Video Visit from 03/09/2024 in [facility] Video Visit from 12/16/2023 in [facility] Video Visit from 10/15/2023 in [facility] Video Visit from 02/11/2023 in [facility] Video Visit from 12/03/2022 in [facility]  PHQ-2 Total Score 1 3 5 1 3   PHQ-9 Total Score 4 7 11 10 10    Flowsheet Row ED from 01/22/2024 in [facility] Video Visit from 10/22/2022 in [facility] Video Visit from 01/02/2021 in [facility]  C-SSRS RISK CATEGORY No Risk Error: Q7 should not be populated when Q6 is No Error: Q7 should not be populated when Q6 is No    Collaboration of Care: Collaboration of Care: Medication Management AEB medication prescription  Patient/Guardian was advised Release of Information must be obtained prior to any record release in order to collaborate their care with an outside provider. Patient/Guardian was advised if they have not already done so to contact the registration department to sign all necessary forms in order for us  to release information regarding their care.   Consent: Patient/Guardian gives verbal consent for treatment and assignment of benefits for services provided during this visit. Patient/Guardian expressed understanding and agreed to proceed.    Arvella CHRISTELLA Finder, MD 09/15/2024, 1:23 PM   Virtual Visit via Video Note  I connected  with Olivia Le on 09/15/24 at  1:00 PM EST by a video enabled telemedicine application and verified that I am speaking with the correct person using two identifiers.  Location: Patient: Home Provider: Home Office   I discussed the limitations of evaluation and management by telemedicine and the availability of in person appointments. The patient expressed understanding and agreed to proceed.   I discussed the assessment and treatment plan with the patient. The patient was provided an opportunity to ask questions and all were answered. The patient agreed with the plan and demonstrated an understanding of the instructions.   The patient was advised to call back or seek an in-person evaluation if the symptoms worsen or if the condition fails to improve as anticipated.  I provided 10 minutes of non-face-to-face time during this encounter.   Arvella CHRISTELLA Finder, MD

## 2024-09-15 ENCOUNTER — Telehealth (HOSPITAL_COMMUNITY): Admitting: Psychiatry

## 2024-09-15 ENCOUNTER — Encounter (HOSPITAL_COMMUNITY): Payer: Self-pay | Admitting: Psychiatry

## 2024-09-15 DIAGNOSIS — F411 Generalized anxiety disorder: Secondary | ICD-10-CM

## 2024-09-15 DIAGNOSIS — Z8659 Personal history of other mental and behavioral disorders: Secondary | ICD-10-CM

## 2024-09-15 DIAGNOSIS — F331 Major depressive disorder, recurrent, moderate: Secondary | ICD-10-CM

## 2024-09-15 MED ORDER — QUETIAPINE FUMARATE 100 MG PO TABS: ORAL_TABLET | ORAL | 0 refills | Status: AC

## 2024-09-15 MED ORDER — BUPROPION HCL ER (XL) 300 MG PO TB24
300.0000 mg | ORAL_TABLET | ORAL | 0 refills | Status: AC
Start: 2024-11-08 — End: 2025-11-08

## 2024-09-15 MED ORDER — ATOMOXETINE HCL 80 MG PO CAPS
80.0000 mg | ORAL_CAPSULE | Freq: Every day | ORAL | 2 refills | Status: AC
Start: 2024-09-15 — End: 2025-09-15

## 2024-09-15 MED ORDER — FLUOXETINE HCL 10 MG PO CAPS: 10.0000 mg | ORAL_CAPSULE | Freq: Every day | ORAL | 2 refills | Status: AC

## 2024-10-16 ENCOUNTER — Other Ambulatory Visit (HOSPITAL_COMMUNITY): Payer: Self-pay | Admitting: Psychiatry

## 2024-10-16 DIAGNOSIS — Z8659 Personal history of other mental and behavioral disorders: Secondary | ICD-10-CM

## 2024-10-16 DIAGNOSIS — F411 Generalized anxiety disorder: Secondary | ICD-10-CM

## 2024-10-16 DIAGNOSIS — F331 Major depressive disorder, recurrent, moderate: Secondary | ICD-10-CM

## 2024-10-20 ENCOUNTER — Other Ambulatory Visit (HOSPITAL_COMMUNITY): Payer: Self-pay | Admitting: Psychiatry

## 2024-10-20 DIAGNOSIS — F331 Major depressive disorder, recurrent, moderate: Secondary | ICD-10-CM

## 2024-10-20 DIAGNOSIS — Z8659 Personal history of other mental and behavioral disorders: Secondary | ICD-10-CM

## 2024-10-20 DIAGNOSIS — F411 Generalized anxiety disorder: Secondary | ICD-10-CM

## 2024-11-14 NOTE — Progress Notes (Unsigned)
 BH MD/PA/NP OP Progress Note  11/14/2024 1:23 PM Olivia Le  MRN:  [id number]  Visit Diagnosis:  No diagnosis found.    Assessment: Olivia Le is a 34 y.o. female with a psychiatric history of anxiety, depression, bipolar disorder, ADHD, alcohol dependence (sober since 2022), and SI who presents virtually to [facility] for initial evaluation on 06/02/2024.  At initial evaluation patient reports symptoms of hopelessness, decreased appetite, isolation, fatigue, excessive worry, restlessness, and feelings of being easily overwhelmed.  She denies any SI/HI or thoughts of self-harm.  She denied any history of mania, hypomania, paranoia, delusions, or psychosis.  Patient does carry a history of alcohol use disorder and has been sober since 7.  She also had a diagnosis of ADHD in childhood.  Patient met criteria for MDD and GAD today.  Neuropsych testing referral was placed for further clarification on ADHD diagnosis.  Olivia Le presents for follow-up evaluation. Today, 11/14/2024, patient    anxiety symptoms remain stable with some improvement in concentration following initiation of atomoxetine .  Depression symptoms remain ongoing with amotivation, anhedonia, and fatigue.  Lexapro  had been titrated however patient reported increased affective blunting and discontinued the medication 2 weeks ago.  She was cautioned to reach out to provider before making medication changes.  Will start on Prozac  10 mg daily and reviewed risk and benefits.  Patient was also reminded to complete metabolic labs.  She is still waiting on neuropsych testing.  Will follow-up in 2 months.  Risk Assessment: An assessment of suicide and violence risk factors was performed as part of this evaluation and is not significantly changed from the last visit. While future psychiatric events cannot be accurately predicted, the patient does not currently require acute inpatient  psychiatric care and does not currently meet [place]  involuntary commitment criteria. Patient was given contact information for crisis resources, behavioral health clinic and was instructed to call 911 for emergencies.   Plan: # MDD/GAD Past medication trials:  Status of problem: Ongoing Interventions: - Continue Seroquel  100 mg - Continue Bupropion  XL 300 mg daily - Start Prozac  10 mg daily - Discontinue Lexapro  20 mg daily - CMP, LFT, TSH, prolactin, A1c, Utox pending  - Lipid panel ordered  #Hx ADHD Past medication trials: Intuniv , Strattera ,Qelbree, Adderall, and Vyvanse (most effective) Status of problem: Ongoing Interventions: - Neuropsych testing referral to [facility] [place] [facility] - Increase Strattera  80 mg daily  #AUD in remission Past medication trials:  Status of problem: Controlled Interventions: --Continue to attend AA meetings twice a week  Chief Complaint:  No chief complaint on file.  HPI: Olivia Le reports     ongoing depression.  She discontinued the Lexapro  2 weeks ago as she felt that it was having the opposite effect on her depression.  Patient describes feeling more apathetic or emotionally blunted on the medication that she had off of it.  Since discontinuing Lexapro  2 weeks ago this apathy has gradually improved.  That said ongoing depression symptoms are still present.  Anxiety symptoms have been well-controlled and there has been some improvement in attention following the initiation of atomoxetine .  Patient denies any adverse side effects from the Wellbutrin , Seroquel , or atomoxetine .  Discussed titration of atomoxetine  today and patient is still waiting on neuropsych testing.  In regards to depressive symptoms we discussed other antidepressant treatment options.  After reviewed patient's only other trials are on venlafaxine and mirtazapine .  She is unable to recall what happened to venlafaxine.  Given that  she is hoping to start  stimulant medication in the future SSRI medication would be better indicated to minimize excess norepinephrine.  Past Psychiatric History:  Past psychiatric diagnoses: MDD, GAD, Bipolar 2 disorder, AUD, self reported ADHD Psychiatric hospitalizations:In may of 2021 in the context of depression with SI and AUD Past suicide attempts: Denies Hx of self harm: Denies Hx of violence towards others: Denies Prior psychiatric providers: Niki Bach, [facility], [facility] Prior therapy: Olivia Le Access to firearms: Denies  Prior medication trials:  Wellbutrin  450 mg daily, Strattera , Intuniv  2 mg daily, and Qelbree (all had limited benefit for ADHD), Lamictal  100 mg, Buspar  30 mg twice daily, and gabapentin  300 mg three times daily discontinued the latter 3 due to improvement in mood symptoms. Lexapro  (), venlafaxine, Remeron , atarax , propranolol , naltrexone   Substance use:  ETOH First Use: early 76s. Has used 3-4 times a week with 3-4 drinks per occasion in the past. Reports that she has been sober since 2022. She maintained sobriety through [facility], and [employer] Now attends meetings twice a week.   Past Medical History:  Past Medical History:  Diagnosis Date   Kidney stone    Polycystic disease, ovaries     Past Surgical History:  Procedure Laterality Date   TONSILLECTOMY     Family History:  Family History  Problem Relation Age of Onset   Hypertension Mother    Healthy Father     Social History:  Social History   Socioeconomic History   Marital status: Single    Spouse name: Not on file   Number of children: Not on file   Years of education: Not on file   Highest education level: Not on file  Occupational History   Not on file  Tobacco Use   Smoking status: Never   Smokeless tobacco: Never  Vaping Use   Vaping status: Some Days   Substances: THC, CBD   Devices: Jule  Substance and Sexual Activity   Alcohol use: Yes    Comment: weekly   Drug use: No    Sexual activity: Not on file  Other Topics Concern   Not on file  Social History Narrative   Not on file   Social Drivers of Health   Tobacco Use: Low Risk (09/15/2024)   Patient History    Smoking Tobacco Use: Never    Smokeless Tobacco Use: Never    Passive Exposure: Not on file  Financial Resource Strain: Not on file  Food Insecurity: Not on file  Transportation Needs: Not on file  Physical Activity: Not on file  Stress: Not on file  Social Connections: Unknown (03/12/2022)   [facility]   Social Network    Social Network: Not on file  Depression (PHQ2-9): Low Risk (03/09/2024)   Depression (PHQ2-9)    PHQ-2 Score: 4  Recent Concern: Depression (PHQ2-9) - Medium Risk (12/16/2023)   Depression (PHQ2-9)    PHQ-2 Score: 7  Alcohol Screen: Not on file  Housing: Not on file  Utilities: Not on file  Health Literacy: Not on file    Allergies: No Known Allergies  Current Medications: Current Outpatient Medications  Medication Sig Dispense Refill   Ascorbic Acid (VITAMIN C WITH ROSE HIPS) 500 MG tablet Take 500 mg by mouth daily.     atomoxetine  (STRATTERA ) 80 MG capsule Take 1 capsule (80 mg total) by mouth daily. 30 capsule 2   buPROPion  (WELLBUTRIN  XL) 300 MG 24 hr tablet Take 1 tablet (300 mg total) by  mouth every morning. 90 tablet 0   cholecalciferol (VITAMIN D3) 25 MCG (1000 UNIT) tablet Take 1,000 Units by mouth daily.     FLUoxetine  (PROZAC ) 10 MG capsule Take 1 capsule (10 mg total) by mouth daily. 30 capsule 2   HYDROcodone -acetaminophen  (NORCO/VICODIN) 5-325 MG tablet Take 1-2 tablets by mouth every 6 (six) hours as needed. 15 tablet 0   QUEtiapine  (SEROQUEL ) 100 MG tablet TAKE 1 TABLET BY MOUTH EVERY DAY AT NIGHT 90 tablet 0   No current facility-administered medications for this visit.     Psychiatric Specialty Exam: There were no vitals taken for this visit.There is no height or weight on file to calculate BMI. Review of Systems  General  Appearance: Fairly Groomed  Eye Contact:  Good  Speech:  Clear and Coherent  Volume:  Normal  Mood:  Euthymic and dysthymic  Affect:  Appropriate  Thought Content: Logical   Suicidal Thoughts:  No  Homicidal Thoughts:  No  Thought Process:  Coherent and Goal Directed  Orientation:  Full (Time, Place, and Person)    Memory: Immediate;   Fair  Judgment:  Fair  Insight:  Good  Concentration:  Concentration: Fair  Recall:  not formally assessed   Fund of Knowledge: Good  Language: Good  Psychomotor Activity:  Normal  Akathisia:  No  AIMS (if indicated): not done  Assets:  Communication Skills Desire for Improvement Financial Resources/Insurance Housing  ADL's:  Intact  Cognition: WNL  Sleep:  Good   Metabolic Disorder Labs: Lab Results  Component Value Date   HGBA1C 4.9 03/29/2020   MPG 93.93 03/29/2020   No results found for: PROLACTIN Lab Results  Component Value Date   CHOL 149 03/29/2020   TRIG 29 03/29/2020   HDL 97 03/29/2020   CHOLHDL 1.5 03/29/2020   VLDL 6 03/29/2020   LDLCALC 46 03/29/2020   Lab Results  Component Value Date   TSH 2.297 03/29/2020    Therapeutic Level Labs: No results found for: LITHIUM No results found for: VALPROATE No results found for: CBMZ   Screenings: AIMS    Flowsheet Row [facility]  AIMS Total Score 0   AUDIT    Flowsheet Row Admission (Discharged) from [facility]  Alcohol Use Disorder Identification Test Final Score (AUDIT) 10   GAD-7    Flowsheet Row Video Visit from 03/09/2024 in [facility] Video Visit from 12/16/2023 in [facility] Video Visit from 10/15/2023 in [facility] Video Visit from 02/11/2023 in [facility] Video Visit from 12/03/2022  in [facility]  Total GAD-7 Score 4 9 10 6 9    PHQ2-9    Flowsheet Row Video Visit from 03/09/2024 in [facility] Video Visit from 12/16/2023 in [facility] Video Visit from 10/15/2023 in [facility] Video Visit from 02/11/2023 in [facility] Video Visit from 12/03/2022 in [facility]  PHQ-2 Total Score 1 3 5 1 3   PHQ-9 Total Score 4 7 11 10 10    Flowsheet Row ED from 01/22/2024 in [facility] at [facility] Video Visit from 10/22/2022 in [facility] Video Visit from 01/02/2021 in [facility]  C-SSRS RISK CATEGORY No Risk Error: Q7  should not be populated when Q6 is No Error: Q7 should not be populated when Q6 is No    Collaboration of Care: Collaboration of Care: Medication Management AEB medication prescription  Patient/Guardian was advised Release of Information must be obtained prior to any record release in order to collaborate their care with an outside provider. Patient/Guardian was advised if they have not already done so to contact the registration department to sign all necessary forms in order for us  to release information regarding their care.   Consent: Patient/Guardian gives verbal consent for treatment and assignment of benefits for services provided during this visit. Patient/Guardian expressed understanding and agreed to proceed.    Arvella CHRISTELLA Finder, MD 11/14/2024, 1:23 PM   Virtual Visit via Video Note  I connected with Truman Pali on 11/14/2024 at 11:30 AM EST by a video enabled telemedicine application and verified that I am speaking with the correct person using two identifiers.  Location: Patient: Home Provider: Home Office   I discussed the limitations of evaluation and management by telemedicine and the  availability of in person appointments. The patient expressed understanding and agreed to proceed.   I discussed the assessment and treatment plan with the patient. The patient was provided an opportunity to ask questions and all were answered. The patient agreed with the plan and demonstrated an understanding of the instructions.   The patient was advised to call back or seek an in-person evaluation if the symptoms worsen or if the condition fails to improve as anticipated.  I provided 10 minutes of non-face-to-face time during this encounter.   Arvella CHRISTELLA Finder, MD

## 2024-11-17 ENCOUNTER — Telehealth (HOSPITAL_COMMUNITY): Admitting: Psychiatry
